# Patient Record
Sex: Female | Born: 1965 | Race: White | Hispanic: No | Marital: Married | Smoking: Never smoker
Health system: Southern US, Community
[De-identification: ages and names within clinical notes are randomized; demographics above are authoritative.]

## PROBLEM LIST (undated history)

## (undated) DIAGNOSIS — I10 Essential (primary) hypertension: Secondary | ICD-10-CM

## (undated) DIAGNOSIS — E119 Type 2 diabetes mellitus without complications: Secondary | ICD-10-CM

## (undated) DIAGNOSIS — Z9889 Other specified postprocedural states: Secondary | ICD-10-CM

## (undated) HISTORY — DX: Type 2 diabetes mellitus without complications: E11.9

## (undated) HISTORY — PX: GALLBLADDER SURGERY: SHX652

## (undated) HISTORY — PX: CHOLECYSTECTOMY: SHX55

## (undated) HISTORY — DX: Essential (primary) hypertension: I10

---

## 2016-07-28 ENCOUNTER — Encounter: Payer: Self-pay | Admitting: Neurology

## 2016-07-28 ENCOUNTER — Ambulatory Visit (INDEPENDENT_AMBULATORY_CARE_PROVIDER_SITE_OTHER): Payer: BLUE CROSS/BLUE SHIELD | Admitting: Neurology

## 2016-07-28 VITALS — BP 139/99 | HR 81 | Ht 64.0 in | Wt 188.2 lb

## 2016-07-28 DIAGNOSIS — R200 Anesthesia of skin: Secondary | ICD-10-CM

## 2016-07-28 DIAGNOSIS — R202 Paresthesia of skin: Secondary | ICD-10-CM | POA: Diagnosis not present

## 2016-07-28 NOTE — Patient Instructions (Signed)
I had a long discussion with the patient with regards to her intermittent right face and neck paresthesias which seem to have subsided. Continue gabapentin 300 mg at night for now. Check MRI scan of the brain with and without contrast as well as cervical spine to look for craniovertebral junction lesions. She was advised to call me if she had recurrent or new symptoms and see me for follow-up in 2 months.  Paresthesia Introduction Paresthesia is a burning or prickling feeling. This feeling can happen in any part of the body. It often happens in the hands, arms, legs, or feet. Usually, it is not painful. In most cases, the feeling goes away in a short time and is not a sign of a serious problem. Follow these instructions at home:  Avoid drinking alcohol.  Try massage or needle therapy (acupuncture) to help with your problems.  Keep all follow-up visits as told by your doctor. This is important. Contact a doctor if:  You keep on having episodes of paresthesia.  Your burning or prickling feeling gets worse when you walk.  You have pain or cramps.  You feel dizzy.  You have a rash. Get help right away if:  You feel weak.  You have trouble walking or moving.  You have problems speaking, understanding, or seeing.  You feel confused.  You cannot control when you pee (urinate) or poop (bowel movement).  You lose feeling (numbness) after an injury.  You pass out (faint). This information is not intended to replace advice given to you by your health care provider. Make sure you discuss any questions you have with your health care provider. Document Released: 05/14/2008 Document Revised: 11/07/2015 Document Reviewed: 05/28/2014  2017 Elsevier

## 2016-07-28 NOTE — Progress Notes (Signed)
[name] [address] [place] [address] [phone]       OFFICE CONSULT NOTE  [name] Date of Birth:  April 24, 1966 Medical Record Number:  [id number]   Referring MD: Consuello Masse Reason for Referral:  numbness  HPI: Ms Graves is a pleasant 51 year old Caucasian lady who noticed right face followed by neck numbness since November 2017. She is unable to attribute the onset of symptoms to any particular event. The numbness started gradually occurring occasionally off and on but there were periods when she had daily episodes of numbness. This lasts for several minutes to hours. There are no clear triggers. The numbness would last all day. She was started on Topamax by her primary physician which seem to have helped but she developed itching and despite taking Benadryl for a week she finally stopped it. She was switched to gabapentin 300 mg which she is taking at night. She states she's had no numbness would last 2 weeks. She is tolerating gabapentin well without any weight gain, edema or dizziness. Patient had a CT scan of the head done on 06/13/16 which have personally reviewed and was normal. She also had basic metabolic panel labs as well as CBC on 06/16/16 which were normal. Her cholesterol profile was borderline with LDL of 84 mg percent. Patient denies any significant headache except only on one day she had a mild headache. She denies any blurred vision, loss of vision, diplopia, vertigo, gait and balance problems. She denies significant fatigue, weight loss or change in appetite. She denies being under significant stress or anxiety. Patient had somewhat similar episode of numbness involving the right cheek in August 2013 and was seen by me. At that time MRI scan of the brain was obtained on 02/25/12 which was normal and MRA of the brain showed no significant intracranial stenosis. Patient did have a family history of a twin sister having carotid occlusion at age  76. She was started on aspirin and stated she has been compliant with her blood pressure and cholesterol medications and has not had any new neurovascular symptoms.  ROS:   14 system review of systems is positive for  tingling, numbness, headache and all other systems negative PMH:  Past Medical History:  Diagnosis Date  . Diabetes ([place])   . Hypertension     Social History:  Social History   Social History  . Marital status: Married    Spouse name: N/A  . Number of children: 2  . Years of education: 12   Occupational History  . [employer]    Social History Main Topics  . Smoking status: Never Smoker  . Smokeless tobacco: Never Used  . Alcohol use No  . Drug use: No  . Sexual activity: Not on file     Comment: Married   Other Topics Concern  . Not on file   Social History Narrative   Lives at home w/ her husband and son   Tomie China right-handed, eats left-handed   Caffeine: occasional soda    Medications:   No current outpatient prescriptions on file prior to visit.   No current facility-administered medications on file prior to visit.     Allergies:   Allergies  Allergen Reactions  . Lisinopril Cough  . Topiramate Itching    Physical Exam General: well developed, well nourished middle aged Caucasian lady, seated, in no evident distress Head: head normocephalic and atraumatic.   Neck: supple with no carotid or supraclavicular bruits Cardiovascular:  regular rate and rhythm, no murmurs Musculoskeletal: no deformity Skin:  no rash/petichiae Vascular:  Normal pulses all extremities  Neurologic Exam Mental Status: Awake and fully alert. Oriented to place and time. Recent and remote memory intact. Attention span, concentration and fund of knowledge appropriate. Mood and affect appropriate.  Cranial Nerves: Fundoscopic exam reveals sharp disc margins. Pupils equal, briskly reactive to light. Extraocular movements full without nystagmus. Visual fields  full to confrontation. Hearing intact. Facial sensation intact. Face, tongue, palate moves normally and symmetrically.  Motor: Normal bulk and tone. Normal strength in all tested extremity muscles. Sensory.: intact to touch , pinprick , position and vibratory sensation.  Coordination: Rapid alternating movements normal in all extremities. Finger-to-nose and heel-to-shin performed accurately bilaterally. Gait and Station: Arises from chair without difficulty. Stance is normal. Gait demonstrates normal stride length and balance . Able to heel, toe and tandem walk without difficulty.  Reflexes: 1+ and symmetric. Toes downgoing.       ASSESSMENT: 51 year old lady with intermittent right face and neck paresthesias of unclear etiology which seems to have resolved. Structural lesions at the craniovertebral junction need to be ruled out. Somewhat similar episode to in 2013 with negative evaluation at that time. Family history of premature cerebrovascular disease in the 19s but patient has had no focal TIA or stroke symptoms    PLAN: I had a long discussion with the patient with regards to her intermittent right face and neck paresthesias which seem to have subsided. Continue gabapentin 300 mg at night for now. Check MRI scan of the brain with and without contrast as well as cervical spine to look for craniovertebral junction lesions. She lost her continue aspirin for stroke prevention and maintain strict control of hypertension and lipids and eat a healthy diet and reactive andweight given her family history of premature cerebrovascular disease at a young age. She was advised to call me if she had recurrent or new symptoms and see me for follow-up in 2 months. Greater than 50% time during this 45 minute consultation visit was spent on counseling and coordination of care about her paresthesias, explanation of diagnosis and plan for evaluation, treatment and answering questions Antony Contras, MD  [facility] [facility] [address] [place], [place] [address]  Phone [phone] Fax [phone] Note: This document was prepared with digital dictation and possible smart phrase technology. Any transcriptional errors that result from this process are unintentional.

## 2016-07-29 ENCOUNTER — Telehealth: Payer: Self-pay | Admitting: Neurology

## 2016-07-29 NOTE — Telephone Encounter (Signed)
[employer] did not approve the MR Cervical they did approve the brain but since they are both linked together I can not get a authorization number. The member ID is [id number] & DOB is 2065-06-18. The phone number for the peer to peer is [phone]. Please do in 2 business days. Thank you for your help.

## 2016-07-29 NOTE — Telephone Encounter (Signed)
Noted, thank you for your help!  °

## 2016-07-29 NOTE — Telephone Encounter (Signed)
I spoke to peer to peer and got approval for both scans and auth [id number] and it is valid for 30 days from today

## 2016-08-06 ENCOUNTER — Ambulatory Visit (HOSPITAL_COMMUNITY)
Admission: RE | Admit: 2016-08-06 | Discharge: 2016-08-06 | Disposition: A | Discharge status: Home / Self Care | Payer: BLUE CROSS/BLUE SHIELD | Source: Ambulatory Visit | Attending: Neurology | Admitting: Neurology

## 2016-08-06 DIAGNOSIS — M47812 Spondylosis without myelopathy or radiculopathy, cervical region: Secondary | ICD-10-CM | POA: Insufficient documentation

## 2016-08-06 DIAGNOSIS — R200 Anesthesia of skin: Secondary | ICD-10-CM

## 2016-08-06 DIAGNOSIS — R202 Paresthesia of skin: Secondary | ICD-10-CM

## 2016-08-06 DIAGNOSIS — M4802 Spinal stenosis, cervical region: Secondary | ICD-10-CM | POA: Diagnosis not present

## 2016-08-06 LAB — POCT I-STAT CREATININE: Creatinine, Ser: 0.9 mg/dL (ref 0.44–1.00)

## 2016-08-06 MED ORDER — GADOBENATE DIMEGLUMINE 529 MG/ML IV SOLN
17.0000 mL | Freq: Once | INTRAVENOUS | Status: AC | PRN
  Administered 2016-08-06: 17 mL via INTRAVENOUS

## 2016-09-07 ENCOUNTER — Telehealth: Payer: Self-pay | Admitting: Neurology

## 2016-09-07 NOTE — Telephone Encounter (Signed)
Autumn Graves with [employer] is calling to see if authorization was gotten for the patient who had an MRI on 08-06-16. [employer] denied the claim.

## 2016-09-07 NOTE — Telephone Encounter (Signed)
I spoke to a man in billing with [employer].. I gave him all my information for the authorization not require for her to have the MRI.. I informed him I spoke to [place] on 07/29/16 at 8:33 AM Central time informed me no Josem Kaufmann was require. Then the patient called me on  08/28/16 called me and informed me that her [employer] insurance was not going to pay for it because they told her a Josem Kaufmann is required... I then called [employer] again and on 08/28/16 Jan D informed me Josem Kaufmann was not require and the reference # is [id number].Marland Kitchen

## 2016-10-06 ENCOUNTER — Ambulatory Visit: Payer: BLUE CROSS/BLUE SHIELD | Admitting: Neurology

## 2016-11-23 ENCOUNTER — Encounter (HOSPITAL_COMMUNITY): Payer: Self-pay | Admitting: Emergency Medicine

## 2016-11-23 ENCOUNTER — Emergency Department (HOSPITAL_COMMUNITY)
Admission: EM | Admit: 2016-11-23 | Discharge: 2016-11-23 | Disposition: A | Discharge status: Home / Self Care | Payer: Worker's Compensation | Attending: Emergency Medicine | Admitting: Emergency Medicine

## 2016-11-23 DIAGNOSIS — S0592XA Unspecified injury of left eye and orbit, initial encounter: Secondary | ICD-10-CM | POA: Diagnosis present

## 2016-11-23 DIAGNOSIS — Y9389 Activity, other specified: Secondary | ICD-10-CM | POA: Insufficient documentation

## 2016-11-23 DIAGNOSIS — Y9259 Other trade areas as the place of occurrence of the external cause: Secondary | ICD-10-CM | POA: Diagnosis not present

## 2016-11-23 DIAGNOSIS — Y990 Civilian activity done for income or pay: Secondary | ICD-10-CM | POA: Insufficient documentation

## 2016-11-23 DIAGNOSIS — S0512XA Contusion of eyeball and orbital tissues, left eye, initial encounter: Secondary | ICD-10-CM | POA: Diagnosis not present

## 2016-11-23 DIAGNOSIS — W208XXA Other cause of strike by thrown, projected or falling object, initial encounter: Secondary | ICD-10-CM | POA: Insufficient documentation

## 2016-11-23 MED ORDER — IBUPROFEN 800 MG PO TABS
800.0000 mg | ORAL_TABLET | Freq: Once | ORAL | Status: AC
  Administered 2016-11-23: 800 mg via ORAL
  Filled 2016-11-23: qty 1

## 2016-11-23 MED ORDER — FLUORESCEIN SODIUM 0.6 MG OP STRP
1.0000 | ORAL_STRIP | Freq: Once | OPHTHALMIC | Status: AC
  Administered 2016-11-23: 1 via OPHTHALMIC
  Filled 2016-11-23: qty 1

## 2016-11-23 MED ORDER — TETRACAINE HCL 0.5 % OP SOLN
2.0000 [drp] | Freq: Once | OPHTHALMIC | Status: AC
  Administered 2016-11-23: 2 [drp] via OPHTHALMIC
  Filled 2016-11-23: qty 4

## 2016-11-23 MED ORDER — ACETAMINOPHEN 325 MG PO TABS
650.0000 mg | ORAL_TABLET | Freq: Once | ORAL | Status: AC
  Administered 2016-11-23: 650 mg via ORAL
  Filled 2016-11-23: qty 2

## 2016-11-23 NOTE — ED Triage Notes (Signed)
Moved a wood pallet and got a piece of wood in her lt eye,  Used the eyewash station at work. C/o pain and irritation to that eye

## 2016-11-23 NOTE — Discharge Instructions (Signed)
Your vital signs have been reviewed. Your examination does not reveal any foreign body in eye. There does appear to be a contusion to the left corner of your eye as well as the conjunctiva of your left eye. This may change colors by tomorrow a formal bruising. Please use a cool compress to the left eye to 3 times daily. Please use Tylenol every 4 hours for mild pain, use ibuprofen every 6 hours for severe pain. Please see Dr. Quintin Alto or return to the emergency department if not improving.

## 2016-11-23 NOTE — ED Notes (Signed)
Pt carried to the lab area for urine drug screen.

## 2016-11-23 NOTE — ED Provider Notes (Signed)
[facility] DEPT Provider Note   CSN: [id number] Arrival date & time: 11/23/16  1930     History   Chief Complaint Chief Complaint  Patient presents with  . Eye Injury    HPI Autumn Graves is a 51 y.o. female.  Patient is a 51 year old female who presents to the emergency department with a complaint of left eye pain.  The patient states that she was at work. She was working with a Furniture conservator/restorer. When a piece of wood slipped off the palate and hit her in the eye. She went immediately to the eye wash station. She continued to have pain in the left eye and he presents now to the emergency department for evaluation concerning the injury. She denies any changes in her vision. She denies any excessive drainage, and she has not seen any blood from her arm. The been no recent operations or procedures involving her eye.      Past Medical History:  Diagnosis Date  . Diabetes ([place])   . Hypertension     Patient Active Problem List   Diagnosis Date Noted  . Numbness and tingling of right side of face 07/28/2016  . Paresthesia 07/28/2016    Past Surgical History:  Procedure Laterality Date  . CESAREAN SECTION  1990  . CHOLECYSTECTOMY    . GALLBLADDER SURGERY      OB History    No data available       Home Medications    Prior to Admission medications   Medication Sig Start Date End Date Taking? Authorizing Provider  aspirin 81 MG tablet Take 81 mg by mouth daily.    [provider]  esomeprazole (NEXIUM) 20 MG capsule Take 20 mg by mouth daily.    [provider]  gabapentin (NEURONTIN) 300 MG capsule Take 300 mg by mouth at bedtime. 07/08/16   [provider]  hydrochlorothiazide (MICROZIDE) 12.5 MG capsule Take 12.5 mg by mouth daily.  06/28/16   [provider]  metFORMIN (GLUCOPHAGE-XR) 500 MG 24 hr tablet Take 500 mg by mouth daily. 07/08/16   [provider]  simvastatin (ZOCOR) 20 MG tablet Take 20 mg by mouth daily at 6 PM.   06/28/16   [provider]  traMADol (ULTRAM) 50 MG tablet Take by mouth every 12 (twelve) hours as needed.    [provider]    Family History Family History  Problem Relation Age of Onset  . Cancer Mother   . Heart failure Father   . Stroke Sister   . Colon cancer Maternal Grandmother   . Stroke Maternal Grandfather     Social History Social History  Substance Use Topics  . Smoking status: Never Smoker  . Smokeless tobacco: Never Used  . Alcohol use No     Allergies   Lisinopril and Topiramate   Review of Systems Review of Systems  Constitutional: Negative for activity change and appetite change.  HENT: Negative for congestion, ear discharge, ear pain, facial swelling, nosebleeds, rhinorrhea, sneezing and tinnitus.   Eyes: Positive for pain and redness. Negative for photophobia, discharge, itching and visual disturbance.  Respiratory: Negative for cough, choking, shortness of breath and wheezing.   Cardiovascular: Negative for chest pain, palpitations and leg swelling.  Gastrointestinal: Negative for abdominal pain, blood in stool, constipation, diarrhea, nausea and vomiting.  Genitourinary: Negative for difficulty urinating, dysuria, flank pain, frequency and hematuria.  Musculoskeletal: Negative for back pain, gait problem, myalgias and neck pain.  Skin: Negative for color change, rash  and wound.  Neurological: Negative for dizziness, seizures, syncope, facial asymmetry, speech difficulty, weakness and numbness.  Hematological: Negative for adenopathy. Does not bruise/bleed easily.  Psychiatric/Behavioral: Negative for agitation, confusion, hallucinations, self-injury and suicidal ideas. The patient is not nervous/anxious.      Physical Exam Updated Vital Signs BP (!) 150/98 (BP Location: Right Arm)   Pulse 95   Temp 97.8 F (36.6 C) (Oral)   Resp 20   Ht 5\' 4"  (1.626 m)   Wt 83 kg (183 lb)   SpO2 97%   BMI 31.41 kg/m   Physical Exam    Constitutional: She appears well-developed and well-nourished. No distress.  HENT:  Head: Normocephalic and atraumatic.  Right Ear: External ear normal.  Left Ear: External ear normal.  Eyes: EOM are normal. Pupils are equal, round, and reactive to light. Lids are everted and swept, no foreign bodies found. Right eye exhibits no discharge. Left eye exhibits no discharge and no hordeolum. No foreign body present in the left eye. Left conjunctiva is injected. Left conjunctiva has no hemorrhage. No scleral icterus.  Fundoscopic exam:      The left eye shows no AV nicking, no hemorrhage and no papilledema.  Slit lamp exam:      The right eye shows no anterior chamber bulge.       The left eye shows no corneal abrasion, no corneal ulcer, no foreign body, no hyphema and no anterior chamber bulge.    Mild fluorescein uptake at the 4:00 position of the left conjunctiva. No corneal abrasion or fb noted.  Neck: Neck supple. No tracheal deviation present.  Cardiovascular: Normal rate, regular rhythm and intact distal pulses.   Pulmonary/Chest: Effort normal and breath sounds normal. No stridor. No respiratory distress. She has no wheezes. She has no rales.  Abdominal: Soft. Bowel sounds are normal. She exhibits no distension. There is no tenderness. There is no rebound and no guarding.  Musculoskeletal: She exhibits no edema or tenderness.  Neurological: She is alert. She has normal strength. No cranial nerve deficit (no facial droop, extraocular movements intact, no slurred speech) or sensory deficit. She exhibits normal muscle tone. She displays no seizure activity. Coordination normal.  Skin: Skin is warm and dry. No rash noted.  Psychiatric: She has a normal mood and affect.  Nursing note and vitals reviewed.    ED Treatments / Results  Labs (all labs ordered are listed, but only abnormal results are displayed) Labs Reviewed - No data to display  EKG  EKG Interpretation None        Radiology No results found.  Procedures Procedures (including critical care time)  Medications Ordered in ED Medications  ibuprofen (ADVIL,MOTRIN) tablet 800 mg (not administered)  acetaminophen (TYLENOL) tablet 650 mg (not administered)  tetracaine (PONTOCAINE) 0.5 % ophthalmic solution 2 drop (2 drops Left Eye Given by Other 11/23/16 2017)  fluorescein ophthalmic strip 1 strip (1 strip Left Eye Given by Other 11/23/16 2017)     Initial Impression / Assessment and Plan / ED Course  I have reviewed the triage vital signs and the nursing notes.  Pertinent labs & imaging results that were available during my care of the patient were reviewed by me and considered in my medical decision making (see chart for details).       Final Clinical Impressions(s) / ED Diagnoses MDM Blood pressure is slightly elevated at 150/98. The remainder the vital signs are within normal limits. Visual acuity reviewed. The patient has a contusion at  the corner of the left eye and also at the 4:00 position on the conjunctiva. No significant hemorrhage appreciated at this time. The patient is asked to use a cool compress to the eye. She will use Tylenol every 4 hours or ibuprofen every 6 hours for pain or discomfort. The patient is reassured that there was no foreign body noted on the funduscopic or the fluoroscopy seen exam on. The patient will return to the emergency department for additional evaluation and management if any changes, problems, or concerns.    Final diagnoses:  Contusion of left conjunctiva, initial encounter  Contusion of left eye, initial encounter    New Prescriptions New Prescriptions   No medications on file     Lily Kocher, Hershal Coria 11/25/16 1008    Mesner, Corene Cornea, MD 11/25/16 1642

## 2016-12-29 ENCOUNTER — Encounter: Payer: Self-pay | Admitting: Neurology

## 2016-12-29 ENCOUNTER — Ambulatory Visit (INDEPENDENT_AMBULATORY_CARE_PROVIDER_SITE_OTHER): Payer: BLUE CROSS/BLUE SHIELD | Admitting: Neurology

## 2016-12-29 VITALS — BP 137/88 | HR 74 | Wt 182.0 lb

## 2016-12-29 DIAGNOSIS — R202 Paresthesia of skin: Secondary | ICD-10-CM

## 2016-12-29 NOTE — Patient Instructions (Signed)
I had along d/w patient regarding her intermittent right face and neck paresthesias and reviewed imaging films and answered questions. Recommend continue gabapentin prn and also consider additional daytime dose if necessary and tolerated. If she has sleepiness may try 100 mg dose during daytime. She voiced understanding. Return for f/u only as needed.   Paresthesia Paresthesia is a burning or prickling feeling. This feeling can happen in any part of the body. It often happens in the hands, arms, legs, or feet. Usually, it is not painful. In most cases, the feeling goes away in a short time and is not a sign of a serious problem. Follow these instructions at home:  Avoid drinking alcohol.  Try massage or needle therapy (acupuncture) to help with your problems.  Keep all follow-up visits as told by your doctor. This is important. Contact a doctor if:  You keep on having episodes of paresthesia.  Your burning or prickling feeling gets worse when you walk.  You have pain or cramps.  You feel dizzy.  You have a rash. Get help right away if:  You feel weak.  You have trouble walking or moving.  You have problems speaking, understanding, or seeing.  You feel confused.  You cannot control when you pee (urinate) or poop (bowel movement).  You lose feeling (numbness) after an injury.  You pass out (faint). This information is not intended to replace advice given to you by your health care provider. Make sure you discuss any questions you have with your health care provider. Document Released: 05/14/2008 Document Revised: 11/07/2015 Document Reviewed: 05/28/2014 Elsevier Interactive Patient Education  [employer].

## 2016-12-29 NOTE — Progress Notes (Signed)
[name] [address] [place] [address] [phone]       OFFICE FOLLOW UP VISIT NOTE  Autumn. Autumn Graves Date of Birth:  [birth year] Medical Record Number:  [id number]   Referring MD: Consuello Masse Reason for Referral:  numbness  [id number] Consult 07/28/2016 : Autumn Graves is a pleasant 51 year old Caucasian lady who noticed right face followed by neck numbness since November 2017. She is unable to attribute the onset of symptoms to any particular event. The numbness started gradually occurring occasionally off and on but there were periods when she had daily episodes of numbness. This lasts for several minutes to hours. There are no clear triggers. The numbness would last all day. She was started on Topamax by her primary physician which seem to have helped but she developed itching and despite taking Benadryl for a week she finally stopped it. She was switched to gabapentin 300 mg which she is taking at night. She states she's had no numbness would last 2 weeks. She is tolerating gabapentin well without any weight gain, edema or dizziness. Patient had a CT scan of the head done on 06/13/16 which have personally reviewed and was normal. She also had basic metabolic panel labs as well as CBC on 06/16/16 which were normal. Her cholesterol profile was borderline with LDL of 84 mg percent. Patient denies any significant headache except only on one day she had a mild headache. She denies any blurred vision, loss of vision, diplopia, vertigo, gait and balance problems. She denies significant fatigue, weight loss or change in appetite. She denies being under significant stress or anxiety. Patient had somewhat similar episode of numbness involving the right cheek in August 2013 and was seen by me. At that time MRI scan of the brain was obtained on 02/25/12 which was normal and MRA of the brain showed no significant intracranial stenosis. Patient did have a family history of a twin  sister having carotid occlusion at age 69. She was started on aspirin and stated she has been compliant with her blood pressure and cholesterol medications and has not had any new neurovascular symptoms. Update 12/29/2016 : She returns for follow-up after last visit 5 months ago. She states that numbness and tingling in the right side of the face and neck and very rarely in the right hand is much improved. This can still occur around once a week or so. Last less than an hour. She does take gabapentin which seems to help. She had MRI scan of the brain done on 08/06/16 which have personally reviewed and shows no significant abnormality. MRI scan of cervical spine shows bulging disc at C5-6 and C6-7 with mild canal narrowing but no significant root impingement. Patient denies any significant headaches, radicular pain and weakness gait or balance problems. She has no new complaints today. ROS:   14 system review of systems is positive for  tingling, numbness, back pain and all other systems negative PMH:  Past Medical History:  Diagnosis Date  . Diabetes ([place])   . Hypertension     Social History:  Social History   Social History  . Marital status: Married    Spouse name: N/A  . Number of children: 2  . Years of education: 12   Occupational History  . [employer]    Social History Main Topics  . Smoking status: Never Smoker  . Smokeless tobacco: Never Used  . Alcohol use No  . Drug use: No  . Sexual activity:  Not on file     Comment: Married   Other Topics Concern  . Not on file   Social History Narrative   Lives at home w/ her husband and son   Tomie China right-handed, eats left-handed   Caffeine: occasional soda    Medications:   Current Outpatient Prescriptions on File Prior to Visit  Medication Sig Dispense Refill  . aspirin 81 MG tablet Take 81 mg by mouth daily.    Marland Kitchen esomeprazole (NEXIUM) 20 MG capsule Take 20 mg by mouth daily.    Marland Kitchen gabapentin (NEURONTIN) 300 MG  capsule Take 300 mg by mouth at bedtime.  12  . hydrochlorothiazide (MICROZIDE) 12.5 MG capsule Take 12.5 mg by mouth daily.   11  . metFORMIN (GLUCOPHAGE-XR) 500 MG 24 hr tablet Take 500 mg by mouth daily.  3  . simvastatin (ZOCOR) 20 MG tablet Take 20 mg by mouth daily at 6 PM.   11  . traMADol (ULTRAM) 50 MG tablet Take by mouth every 12 (twelve) hours as needed.     No current facility-administered medications on file prior to visit.     Allergies:   Allergies  Allergen Reactions  . Lisinopril Cough  . Topiramate Itching    Physical Exam General: well developed, well nourished middle aged Caucasian lady, seated, in no evident distress Head: head normocephalic and atraumatic.   Neck: supple with no carotid or supraclavicular bruits Cardiovascular: regular rate and rhythm, no murmurs Musculoskeletal: no deformity Skin:  no rash/petichiae Vascular:  Normal pulses all extremities  Neurologic Exam Mental Status: Awake and fully alert. Oriented to place and time. Recent and remote memory intact. Attention span, concentration and fund of knowledge appropriate. Mood and affect appropriate.  Cranial Nerves: Fundoscopic exam not done Pupils equal, briskly reactive to light. Extraocular movements full without nystagmus. Visual fields full to confrontation. Hearing intact. Facial sensation intact. Face, tongue, palate moves normally and symmetrically.  Motor: Normal bulk and tone. Normal strength in all tested extremity muscles. Sensory.: intact to touch , pinprick , position and vibratory sensation.  Coordination: Rapid alternating movements normal in all extremities. Finger-to-nose and heel-to-shin performed accurately bilaterally. Gait and Station: Arises from chair without difficulty. Stance is normal. Gait demonstrates normal stride length and balance . Able to heel, toe and tandem walk without difficulty.  Reflexes: 1+ and symmetric. Toes downgoing.       ASSESSMENT: 51 year old  lady with intermittent right face and neck paresthesias of unclear etiology which seems to have resolved. Structural lesions at the craniovertebral junction need to be ruled out. Somewhat similar episode to in 2013 with negative evaluation at that time.     PLAN: I had along d/w patient regarding her intermittent right face and neck paresthesias and reviewed imaging films and answered questions. Recommend continue gabapentin prn and also consider additional daytime dose if necessary and tolerated. If she has sleepiness may try 100 mg dose during daytime. She voiced understanding. Return for f/u only as needed. Greater than 50% time during this 25 minute  visit was spent on counseling and coordination of care about her paresthesias, explanation of diagnosis and plan for evaluation, treatment and answering questions Antony Contras, MD  [facility] [address] [place], [place] [address]  Phone [phone] Fax [phone] Note: This document was prepared with digital dictation and possible smart phrase technology. Any transcriptional errors that result from this process are unintentional.

## 2017-03-18 DIAGNOSIS — E1165 Type 2 diabetes mellitus with hyperglycemia: Secondary | ICD-10-CM | POA: Diagnosis not present

## 2017-03-18 DIAGNOSIS — E782 Mixed hyperlipidemia: Secondary | ICD-10-CM | POA: Diagnosis not present

## 2017-03-18 DIAGNOSIS — E1140 Type 2 diabetes mellitus with diabetic neuropathy, unspecified: Secondary | ICD-10-CM | POA: Diagnosis not present

## 2017-03-18 DIAGNOSIS — E7800 Pure hypercholesterolemia, unspecified: Secondary | ICD-10-CM | POA: Diagnosis not present

## 2017-03-23 DIAGNOSIS — I10 Essential (primary) hypertension: Secondary | ICD-10-CM | POA: Diagnosis not present

## 2017-03-23 DIAGNOSIS — E782 Mixed hyperlipidemia: Secondary | ICD-10-CM | POA: Diagnosis not present

## 2017-03-23 DIAGNOSIS — E6609 Other obesity due to excess calories: Secondary | ICD-10-CM | POA: Diagnosis not present

## 2017-03-23 DIAGNOSIS — R51 Headache: Secondary | ICD-10-CM | POA: Diagnosis not present

## 2017-03-23 DIAGNOSIS — Z23 Encounter for immunization: Secondary | ICD-10-CM | POA: Diagnosis not present

## 2017-04-24 IMAGING — MR MR HEAD WO/W CM
9 of 14 series · 27 of 48 positions shown · IV contrast (multihance)
Comparison: CT head 06/13/2016.

CLINICAL DATA: RIGHT-sided facial numbness and RIGHT arm numbness
for 3 months. No known injury.

EXAM:
MRI HEAD WITHOUT AND WITH CONTRAST
MRI CERVICAL SPINE WITHOUT AND WITH CONTRAST
TECHNIQUE: Multiplanar, multiecho pulse sequences of the brain and surrounding
structures, and cervical spine, to include the craniocervical
junction and cervicothoracic junction, were obtained without and
with intravenous contrast.
CONTRAST:  17mL MULTIHANCE GADOBENATE DIMEGLUMINE 529 MG/ML IV SOLN

[Series 6: DWI · axial · 3.0mm · 0.82mm/px · z∈[-116,+31]mm · 4 of 50 slices shown (1 of 4)]
[im 1/50]
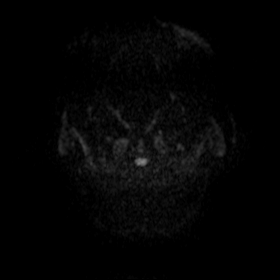
[im 17/50]
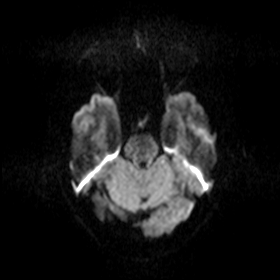
[im 33/50]
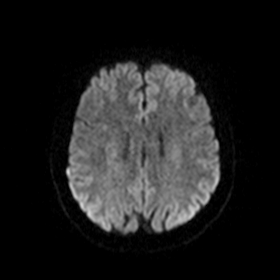
[im 50/50]
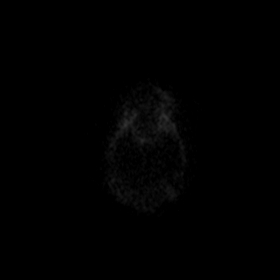

[Series 7: DWI · axial · 3.0mm · 0.82mm/px · z∈[-116,+31]mm · 4 of 50 slices shown (2 of 4)]
[im 1/50]
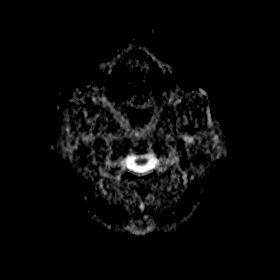
[im 17/50]
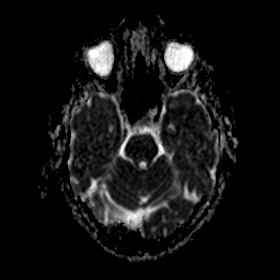
[im 33/50]
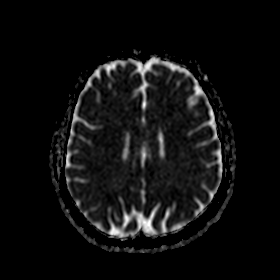
[im 50/50]
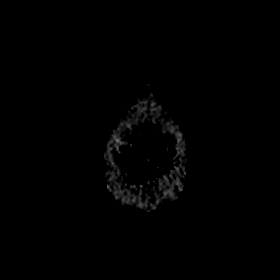

[Series 8: DWI · coronal · 5.0mm · 0.48mm/px · 2 of 32 slices shown (3 of 4)]
[im 1/32]
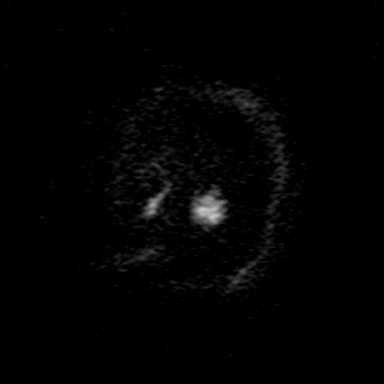
[im 32/32]
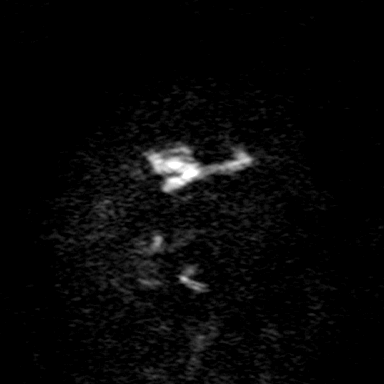

[Series 9: DWI · coronal · 5.0mm · 0.48mm/px · 2 of 32 slices shown (4 of 4)]
[im 1/32]
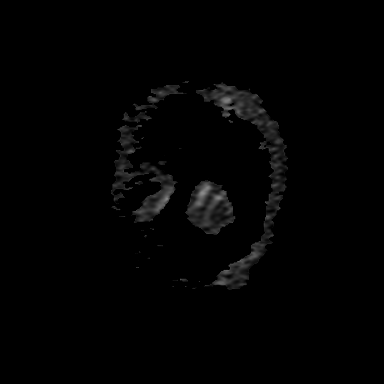
[im 32/32]
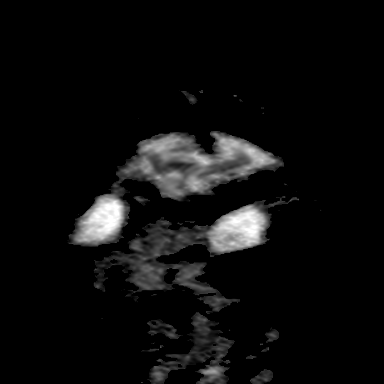

[Series 10: T2 · axial · 5.0mm · 0.60mm/px · z∈[-116,+27]mm · 2 of 23 slices shown (1 of 2)]
[im 1/23]
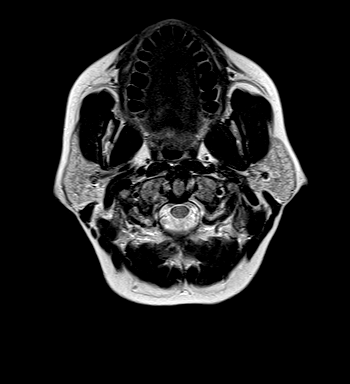
[im 23/23]
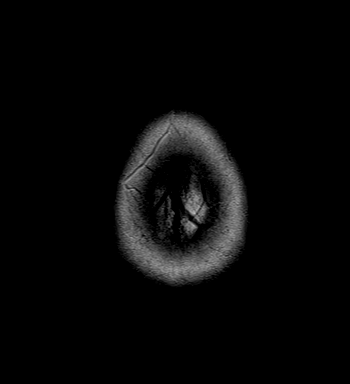

[Series 11: FLAIR · axial · 5.0mm · 0.38mm/px · z∈[-115,+27]mm · 2 of 23 slices shown]
[im 1/23]
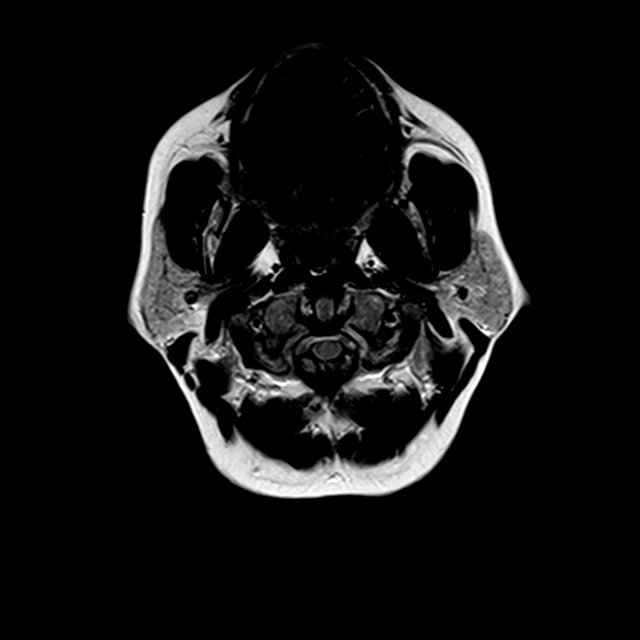
[im 23/23]
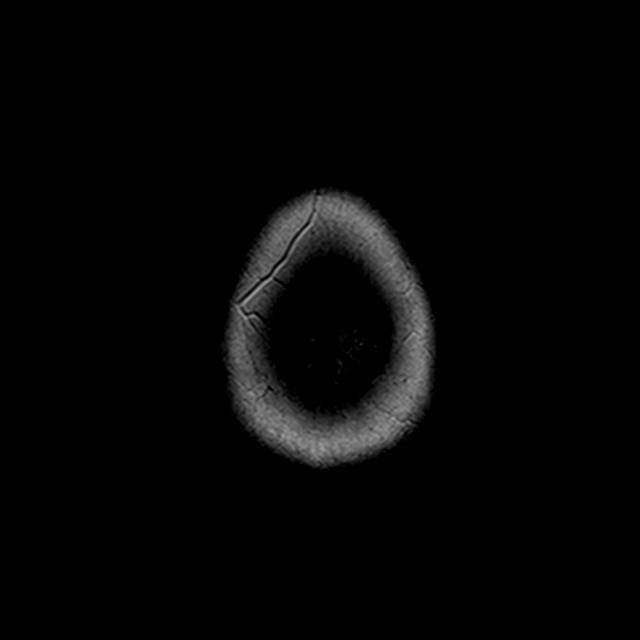

[Series 15: T2 · coronal · 5.0mm · 0.49mm/px · 2 of 28 slices shown (2 of 2)]
[im 1/28]
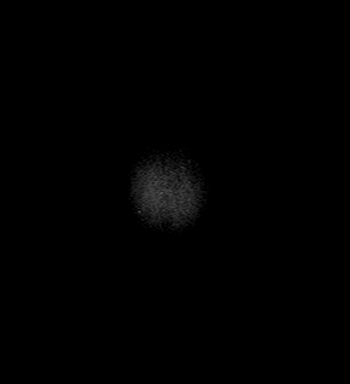
[im 28/28]
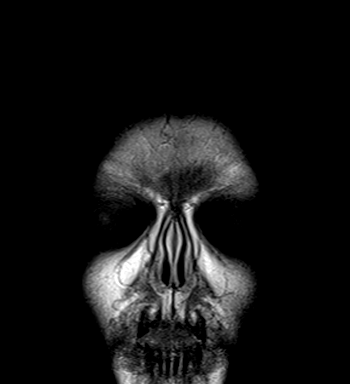

[Series 16: T1 post-contrast · axial · 2.0mm · 0.45mm/px · z∈[-130,+58]mm · 7 of 95 slices shown (1 of 2)]
[im 1/95]
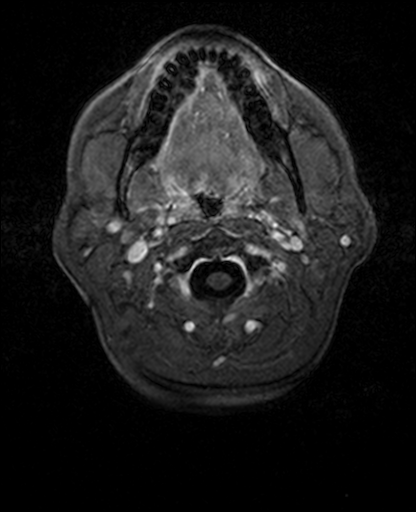
[im 16/95]
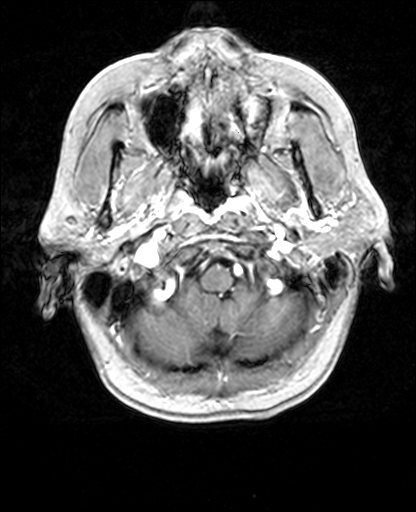
[im 32/95]
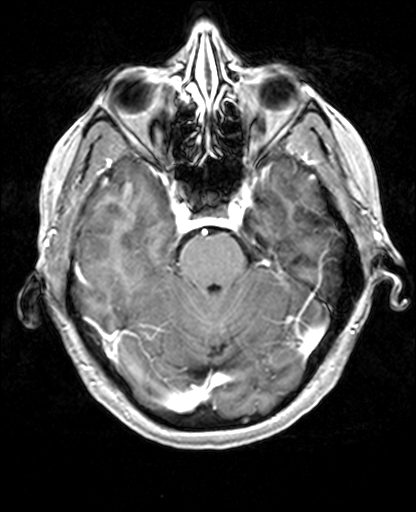
[im 48/95]
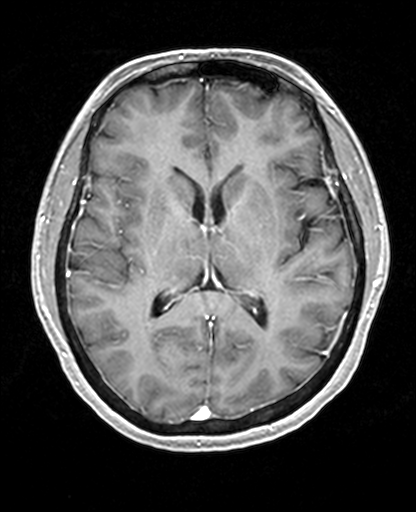
[im 63/95]
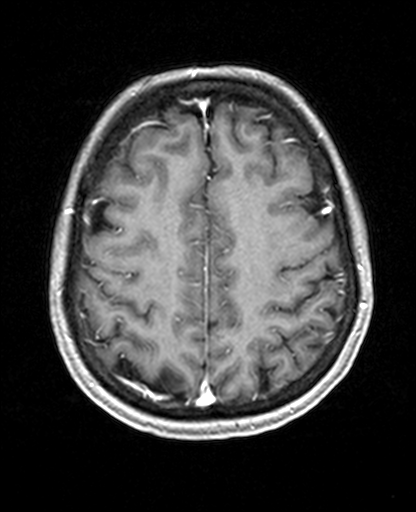
[im 79/95]
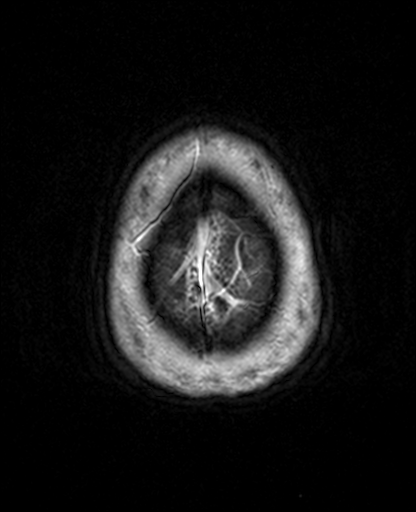
[im 95/95]
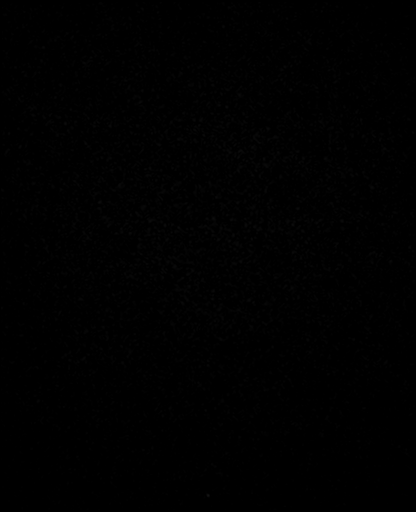

[Series 17: T1 post-contrast · coronal · 5.0mm · 0.40mm/px · 2 of 24 slices shown (2 of 2)]
[im 1/24]
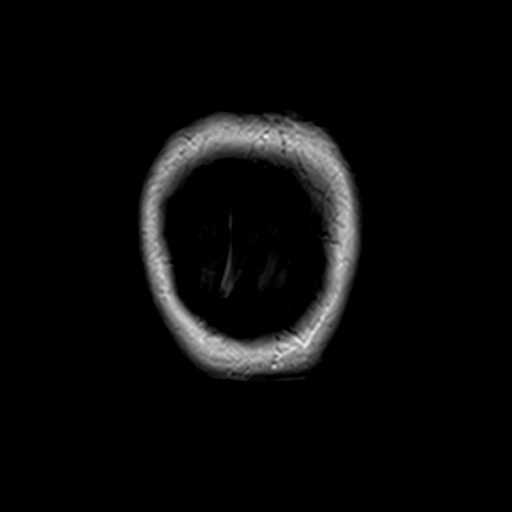
[im 24/24]
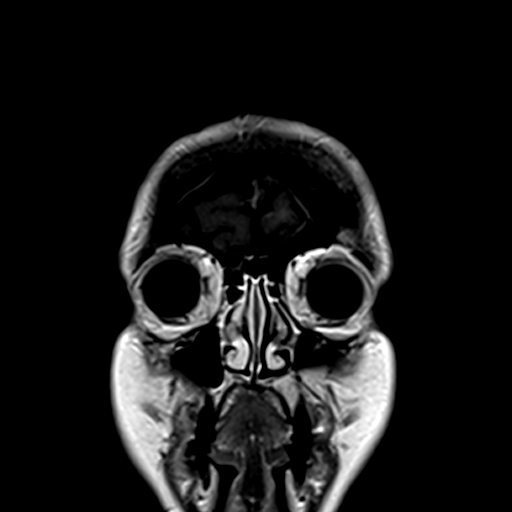

[27 of 48 positions shown; findings below may reference images not displayed]

FINDINGS: MRI HEAD FINDINGS

Brain: No evidence for acute infarction, hemorrhage, mass lesion,
hydrocephalus, or extra-axial fluid. Normal cerebral volume. Minor
white matter disease, nonspecific, likely age related or chronic
microvascular ischemic change. No periventricular predominance to
suggest demyelinating disease.

Post infusion, no abnormal enhancement of the brain or meninges.

Vascular: Flow voids are maintained throughout the carotid, basilar,
and vertebral arteries. There are no areas of chronic hemorrhage.
Major dural venous sinuses are patent.

Skull and upper cervical spine: Unremarkable visualized calvarium,
skullbase, and cervical vertebrae. Pituitary, pineal, cerebellar
tonsils unremarkable.

Sinuses/Orbits: Hypoplastic LEFT maxillary sinus. No layering fluid.
Unremarkable orbits.

Other: No nasopharyngeal pathology or mastoid fluid. Scalp and other
visualized extracranial soft tissues grossly unremarkable.

MRI CERVICAL SPINE FINDINGS

Alignment: Anatomic.

Vertebrae: No worrisome osseous lesions. Modic type 1 changes above
and below C6-C7 active ongoing degenerative change with postcontrast
enhancement. No features suggestive of discitis or worrisome osseous
lesion.

Cord: Stenosis at C5-6 and C6-7 with effacement anterior
subarachnoid space, mild cord flattening. No abnormal enhancement of
the cord. No craniocervical junction lesion. No syrinx.

Posterior Fossa, vertebral arteries, paraspinal tissues: Patent and
equal vertebral arteries. No neck masses.

Disc levels:

C2-3:  Normal.

C3-4:  Normal.

C4-5:  Annular bulge.

C5-6: Disc space narrowing. Central protrusion with osseous ridging.
Mild cord flattening. BILATERAL C6 foraminal narrowing.

C6-7: Disc space narrowing. Central protrusion with osseous ridging.
Mild cord flattening. BILATERAL C7 foraminal narrowing, worse on the
LEFT.

C7-T1:  Unremarkable.
IMPRESSION: No acute intracranial abnormality. No abnormal postcontrast
enhancement.

Moderately advanced cervical spondylosis at C5-6 and C6-7. Stenosis
and mild cord flattening at both levels without abnormal cord
signal. Neural foraminal narrowing could affect the exiting C6
and/or C7 nerve roots.

No craniovertebral junction lesion, or intrinsic cord abnormality.

## 2017-05-04 DIAGNOSIS — M79672 Pain in left foot: Secondary | ICD-10-CM | POA: Diagnosis not present

## 2017-05-04 DIAGNOSIS — M25579 Pain in unspecified ankle and joints of unspecified foot: Secondary | ICD-10-CM | POA: Diagnosis not present

## 2017-05-04 DIAGNOSIS — M79671 Pain in right foot: Secondary | ICD-10-CM | POA: Diagnosis not present

## 2017-06-01 DIAGNOSIS — M25579 Pain in unspecified ankle and joints of unspecified foot: Secondary | ICD-10-CM | POA: Diagnosis not present

## 2017-06-01 DIAGNOSIS — M79672 Pain in left foot: Secondary | ICD-10-CM | POA: Diagnosis not present

## 2017-06-01 DIAGNOSIS — M79671 Pain in right foot: Secondary | ICD-10-CM | POA: Diagnosis not present

## 2017-07-01 DIAGNOSIS — B372 Candidiasis of skin and nail: Secondary | ICD-10-CM | POA: Diagnosis not present

## 2017-07-01 DIAGNOSIS — Z6832 Body mass index (BMI) 32.0-32.9, adult: Secondary | ICD-10-CM | POA: Diagnosis not present

## 2017-07-22 DIAGNOSIS — E782 Mixed hyperlipidemia: Secondary | ICD-10-CM | POA: Diagnosis not present

## 2017-07-22 DIAGNOSIS — E1165 Type 2 diabetes mellitus with hyperglycemia: Secondary | ICD-10-CM | POA: Diagnosis not present

## 2017-07-22 DIAGNOSIS — E1140 Type 2 diabetes mellitus with diabetic neuropathy, unspecified: Secondary | ICD-10-CM | POA: Diagnosis not present

## 2017-07-22 DIAGNOSIS — E7800 Pure hypercholesterolemia, unspecified: Secondary | ICD-10-CM | POA: Diagnosis not present

## 2017-07-27 DIAGNOSIS — Z6832 Body mass index (BMI) 32.0-32.9, adult: Secondary | ICD-10-CM | POA: Diagnosis not present

## 2017-07-27 DIAGNOSIS — G629 Polyneuropathy, unspecified: Secondary | ICD-10-CM | POA: Diagnosis not present

## 2017-07-27 DIAGNOSIS — E1140 Type 2 diabetes mellitus with diabetic neuropathy, unspecified: Secondary | ICD-10-CM | POA: Diagnosis not present

## 2017-07-27 DIAGNOSIS — F411 Generalized anxiety disorder: Secondary | ICD-10-CM | POA: Diagnosis not present

## 2017-07-27 DIAGNOSIS — E782 Mixed hyperlipidemia: Secondary | ICD-10-CM | POA: Diagnosis not present

## 2017-08-05 ENCOUNTER — Encounter (INDEPENDENT_AMBULATORY_CARE_PROVIDER_SITE_OTHER): Payer: Self-pay | Admitting: *Deleted

## 2017-08-10 DIAGNOSIS — Z1231 Encounter for screening mammogram for malignant neoplasm of breast: Secondary | ICD-10-CM | POA: Diagnosis not present

## 2017-09-21 DIAGNOSIS — M720 Palmar fascial fibromatosis [Dupuytren]: Secondary | ICD-10-CM | POA: Diagnosis not present

## 2017-10-07 ENCOUNTER — Encounter (INDEPENDENT_AMBULATORY_CARE_PROVIDER_SITE_OTHER): Payer: Self-pay | Admitting: *Deleted

## 2017-10-08 ENCOUNTER — Other Ambulatory Visit (INDEPENDENT_AMBULATORY_CARE_PROVIDER_SITE_OTHER): Payer: Self-pay | Admitting: *Deleted

## 2017-10-08 DIAGNOSIS — Z1211 Encounter for screening for malignant neoplasm of colon: Secondary | ICD-10-CM | POA: Insufficient documentation

## 2017-10-08 DIAGNOSIS — Z800 Family history of malignant neoplasm of digestive organs: Secondary | ICD-10-CM

## 2017-10-18 DIAGNOSIS — M720 Palmar fascial fibromatosis [Dupuytren]: Secondary | ICD-10-CM | POA: Diagnosis not present

## 2017-10-21 DIAGNOSIS — M720 Palmar fascial fibromatosis [Dupuytren]: Secondary | ICD-10-CM | POA: Diagnosis not present

## 2017-10-21 DIAGNOSIS — M25641 Stiffness of right hand, not elsewhere classified: Secondary | ICD-10-CM | POA: Diagnosis not present

## 2017-10-21 DIAGNOSIS — M79644 Pain in right finger(s): Secondary | ICD-10-CM | POA: Diagnosis not present

## 2017-12-13 ENCOUNTER — Telehealth (INDEPENDENT_AMBULATORY_CARE_PROVIDER_SITE_OTHER): Payer: Self-pay | Admitting: *Deleted

## 2017-12-13 ENCOUNTER — Encounter (INDEPENDENT_AMBULATORY_CARE_PROVIDER_SITE_OTHER): Payer: Self-pay | Admitting: *Deleted

## 2017-12-13 MED ORDER — PEG 3350-KCL-NA BICARB-NACL 420 G PO SOLR: 4000.0000 mL | Freq: Once | ORAL | 0 refills | Status: AC

## 2017-12-13 NOTE — Telephone Encounter (Signed)
Patient needs trilyte 

## 2017-12-21 ENCOUNTER — Telehealth (INDEPENDENT_AMBULATORY_CARE_PROVIDER_SITE_OTHER): Payer: Self-pay | Admitting: *Deleted

## 2017-12-21 NOTE — Telephone Encounter (Signed)
agree

## 2017-12-21 NOTE — Telephone Encounter (Signed)
Referring MD/PCP: sasser   Procedure: tcs  Reason/Indication:  Screening, fam hx colon ca  Has patient had this procedure before?  no  If so, when, by whom and where?    Is there a family history of colon cancer?  Yes, grandmother  Who?  What age when diagnosed?    Is patient diabetic?   yes      Does patient have prosthetic heart valve or mechanical valve?  no  Do you have a pacemaker?  no  Has patient ever had endocarditis? no  Has patient had joint replacement within last 12 months?  no  Is patient constipated or do they take laxatives? no  Does patient have a history of alcohol/drug use?  no  Is patient on blood thinner such as Coumadin, Plavix and/or Aspirin? yes  Medications: simvastatin 20 mg daily, metformin 500 mg daily, losartan 25 mg daily, asa 81 mg daily, zantac daily  Allergies: nkda  Medication Adjustment per Dr Lindi Adie, NP: asa 2 days, hold metformin evening before and morning of procedure  Procedure date & time: 01/20/18 at 730

## 2018-01-11 DIAGNOSIS — M720 Palmar fascial fibromatosis [Dupuytren]: Secondary | ICD-10-CM | POA: Diagnosis not present

## 2018-01-17 ENCOUNTER — Other Ambulatory Visit: Payer: BLUE CROSS/BLUE SHIELD

## 2018-01-20 ENCOUNTER — Encounter (HOSPITAL_COMMUNITY): Payer: Self-pay

## 2018-01-20 ENCOUNTER — Ambulatory Visit (HOSPITAL_COMMUNITY)
Admission: RE | Admit: 2018-01-20 | Discharge: 2018-01-20 | Disposition: A | Discharge status: Home / Self Care | Payer: BLUE CROSS/BLUE SHIELD | Source: Ambulatory Visit | Attending: Internal Medicine | Admitting: Internal Medicine

## 2018-01-20 ENCOUNTER — Other Ambulatory Visit: Payer: Self-pay

## 2018-01-20 ENCOUNTER — Encounter (HOSPITAL_COMMUNITY)
Admission: RE | Disposition: A | Discharge status: Home / Self Care | Payer: Self-pay | Source: Ambulatory Visit | Attending: Internal Medicine

## 2018-01-20 DIAGNOSIS — Z1211 Encounter for screening for malignant neoplasm of colon: Secondary | ICD-10-CM | POA: Insufficient documentation

## 2018-01-20 DIAGNOSIS — Z809 Family history of malignant neoplasm, unspecified: Secondary | ICD-10-CM | POA: Diagnosis not present

## 2018-01-20 DIAGNOSIS — Z79899 Other long term (current) drug therapy: Secondary | ICD-10-CM | POA: Insufficient documentation

## 2018-01-20 DIAGNOSIS — K5730 Diverticulosis of large intestine without perforation or abscess without bleeding: Secondary | ICD-10-CM | POA: Diagnosis not present

## 2018-01-20 DIAGNOSIS — Z8249 Family history of ischemic heart disease and other diseases of the circulatory system: Secondary | ICD-10-CM | POA: Insufficient documentation

## 2018-01-20 DIAGNOSIS — K625 Hemorrhage of anus and rectum: Secondary | ICD-10-CM | POA: Diagnosis not present

## 2018-01-20 DIAGNOSIS — Z823 Family history of stroke: Secondary | ICD-10-CM | POA: Insufficient documentation

## 2018-01-20 DIAGNOSIS — E119 Type 2 diabetes mellitus without complications: Secondary | ICD-10-CM | POA: Diagnosis not present

## 2018-01-20 DIAGNOSIS — Z888 Allergy status to other drugs, medicaments and biological substances status: Secondary | ICD-10-CM | POA: Diagnosis not present

## 2018-01-20 DIAGNOSIS — Z7984 Long term (current) use of oral hypoglycemic drugs: Secondary | ICD-10-CM | POA: Diagnosis not present

## 2018-01-20 DIAGNOSIS — Z800 Family history of malignant neoplasm of digestive organs: Secondary | ICD-10-CM | POA: Diagnosis not present

## 2018-01-20 DIAGNOSIS — D123 Benign neoplasm of transverse colon: Secondary | ICD-10-CM | POA: Insufficient documentation

## 2018-01-20 DIAGNOSIS — K6389 Other specified diseases of intestine: Secondary | ICD-10-CM | POA: Insufficient documentation

## 2018-01-20 DIAGNOSIS — K6289 Other specified diseases of anus and rectum: Secondary | ICD-10-CM | POA: Diagnosis not present

## 2018-01-20 DIAGNOSIS — I10 Essential (primary) hypertension: Secondary | ICD-10-CM | POA: Diagnosis not present

## 2018-01-20 DIAGNOSIS — Z7982 Long term (current) use of aspirin: Secondary | ICD-10-CM | POA: Insufficient documentation

## 2018-01-20 DIAGNOSIS — Z9049 Acquired absence of other specified parts of digestive tract: Secondary | ICD-10-CM | POA: Diagnosis not present

## 2018-01-20 DIAGNOSIS — K633 Ulcer of intestine: Secondary | ICD-10-CM | POA: Diagnosis not present

## 2018-01-20 DIAGNOSIS — K51918 Ulcerative colitis, unspecified with other complication: Secondary | ICD-10-CM | POA: Diagnosis not present

## 2018-01-20 DIAGNOSIS — K598 Other specified functional intestinal disorders: Secondary | ICD-10-CM | POA: Diagnosis not present

## 2018-01-20 HISTORY — PX: COLONOSCOPY: SHX5424

## 2018-01-20 HISTORY — PX: POLYPECTOMY: SHX5525

## 2018-01-20 HISTORY — PX: BIOPSY: SHX5522

## 2018-01-20 LAB — GASTROINTESTINAL PANEL BY PCR, STOOL (REPLACES STOOL CULTURE)
Adenovirus F40/41: NOT DETECTED
Astrovirus: NOT DETECTED
CRYPTOSPORIDIUM: NOT DETECTED
CYCLOSPORA CAYETANENSIS: NOT DETECTED
Campylobacter species: NOT DETECTED
ENTAMOEBA HISTOLYTICA: NOT DETECTED
ENTEROAGGREGATIVE E COLI (EAEC): NOT DETECTED
Enteropathogenic E coli (EPEC): NOT DETECTED
Enterotoxigenic E coli (ETEC): NOT DETECTED
GIARDIA LAMBLIA: NOT DETECTED
NOROVIRUS GI/GII: NOT DETECTED
Plesimonas shigelloides: NOT DETECTED
Rotavirus A: NOT DETECTED
SALMONELLA SPECIES: NOT DETECTED
SAPOVIRUS (I, II, IV, AND V): NOT DETECTED
SHIGELLA/ENTEROINVASIVE E COLI (EIEC): NOT DETECTED
Shiga like toxin producing E coli (STEC): NOT DETECTED
VIBRIO CHOLERAE: NOT DETECTED
Vibrio species: NOT DETECTED
YERSINIA ENTEROCOLITICA: NOT DETECTED

## 2018-01-20 LAB — C DIFFICILE QUICK SCREEN W PCR REFLEX
C DIFFICILE (CDIFF) TOXIN: NEGATIVE
C DIFFICLE (CDIFF) ANTIGEN: NEGATIVE
C Diff interpretation: NOT DETECTED

## 2018-01-20 LAB — GLUCOSE, CAPILLARY: GLUCOSE-CAPILLARY: 103 mg/dL — AB (ref 70–99)

## 2018-01-20 SURGERY — COLONOSCOPY
Anesthesia: Moderate Sedation

## 2018-01-20 MED ORDER — STERILE WATER FOR IRRIGATION IR SOLN
Status: DC | PRN
  Administered 2018-01-20: 08:00:00

## 2018-01-20 MED ORDER — MIDAZOLAM HCL 5 MG/5ML IJ SOLN
INTRAMUSCULAR | Status: AC
  Filled 2018-01-20: qty 10

## 2018-01-20 MED ORDER — MEPERIDINE HCL 50 MG/ML IJ SOLN
INTRAMUSCULAR | Status: DC | PRN
  Administered 2018-01-20 (×2): 25 mg via INTRAVENOUS

## 2018-01-20 MED ORDER — MEPERIDINE HCL 50 MG/ML IJ SOLN
INTRAMUSCULAR | Status: AC
  Filled 2018-01-20: qty 1

## 2018-01-20 MED ORDER — SODIUM CHLORIDE 0.9 % IV SOLN
INTRAVENOUS | Status: DC
  Administered 2018-01-20: 07:00:00 via INTRAVENOUS

## 2018-01-20 MED ORDER — MIDAZOLAM HCL 5 MG/5ML IJ SOLN
INTRAMUSCULAR | Status: DC | PRN
  Administered 2018-01-20: 2 mg via INTRAVENOUS
  Administered 2018-01-20 (×2): 1 mg via INTRAVENOUS
  Administered 2018-01-20 (×2): 2 mg via INTRAVENOUS

## 2018-01-20 NOTE — H&P (Signed)
Autumn Graves is an 52 y.o. female.   Chief Complaint: Patient is here for colonoscopy. HPI: Patient is a 52 year old Caucasian female who is here for screening colonoscopy.  She denies abdominal pain change in bowel habits or rectal bleeding.  This is patient's first exam. Family history is negative for CRC in first-degree relative but positive for colon carcinoma and maternal grandmother who was in her 17s at the time of diagnosis and died 4 years later.  Past Medical History:  Diagnosis Date  . Diabetes ([place])   . Hypertension     Past Surgical History:  Procedure Laterality Date  . CESAREAN SECTION  1990  . CHOLECYSTECTOMY    . GALLBLADDER SURGERY      Family History  Problem Relation Age of Onset  . Cancer Mother   . Heart failure Father   . Stroke Sister   . Colon cancer Maternal Grandmother   . Stroke Maternal Grandfather    Social History:  reports that she has never smoked. She has never used smokeless tobacco. She reports that she does not drink alcohol or use drugs.  Allergies:  Allergies  Allergen Reactions  . Lisinopril Cough  . Topiramate Itching    Medications Prior to Admission  Medication Sig Dispense Refill  . aspirin 81 MG tablet Take 81 mg by mouth daily.    Marland Kitchen ibuprofen (ADVIL,MOTRIN) 200 MG tablet Take 400 mg by mouth 3 (three) times daily as needed for headache or moderate pain.    Marland Kitchen losartan (COZAAR) 25 MG tablet Take 25 mg by mouth every evening.    . metFORMIN (GLUCOPHAGE-XR) 500 MG 24 hr tablet Take 500 mg by mouth daily.  3  . simvastatin (ZOCOR) 20 MG tablet Take 20 mg by mouth daily at 6 PM.   11    No results found for this or any previous visit (from the past 48 hour(s)). No results found.  ROS  Blood pressure (!) 156/83, pulse 83, temperature 98.6 F (37 C), temperature source Oral, resp. rate 20, height 5\' 4"  (1.626 m), weight 84.4 kg, SpO2 96 %. Physical Exam  Constitutional: She appears well-developed and well-nourished.  HENT:   Mouth/Throat: Oropharynx is clear and moist.  Eyes: Conjunctivae are normal.  Neck: No thyromegaly present.  Cardiovascular: Normal rate, regular rhythm and normal heart sounds.  No murmur heard. Respiratory: Effort normal and breath sounds normal.  GI:  Abdomen is symmetrical with Pfannenstiel scar.  Abdomen is soft and nontender with organomegaly or masses.  Musculoskeletal: She exhibits no edema.  Neurological: She is alert.  Skin: Skin is warm and dry.     Assessment/Plan Average or screening colonoscopy.  Hildred Laser, MD 01/20/2018, 7:29 AM

## 2018-01-20 NOTE — Discharge Instructions (Signed)
Colonoscopy, Adult, Care After This sheet gives you information about how to care for yourself after your procedure. Your health care provider may also give you more specific instructions. If you have problems or questions, contact your health care provider. What can I expect after the procedure? After the procedure, it is common to have:  A small amount of blood in your stool for 24 hours after the procedure.  Some gas.  Mild abdominal cramping or bloating.  Follow these instructions at home: General instructions   For the first 24 hours after the procedure: ? Do not drive or use machinery. ? Do not sign important documents. ? Do not drink alcohol. ? Do your regular daily activities at a slower pace than normal. ? Eat soft, easy-to-digest foods. ? Rest often.  Take over-the-counter or prescription medicines only as told by your health care provider.  It is up to you to get the results of your procedure. Ask your health care provider, or the department performing the procedure, when your results will be ready. Relieving cramping and bloating  Try walking around when you have cramps or feel bloated.  Apply heat to your abdomen as told by your health care provider. Use a heat source that your health care provider recommends, such as a moist heat pack or a heating pad. ? Place a towel between your skin and the heat source. ? Leave the heat on for 20-30 minutes. ? Remove the heat if your skin turns bright red. This is especially important if you are unable to feel pain, heat, or cold. You may have a greater risk of getting burned. Eating and drinking  Drink enough fluid to keep your urine clear or pale yellow.  Resume your normal diet as instructed by your health care provider. Avoid heavy or fried foods that are hard to digest.  Avoid drinking alcohol for as long as instructed by your health care provider. Contact a health care provider if:  You have blood in your stool  2-3 days after the procedure. Get help right away if:  You have more than a small spotting of blood in your stool.  You pass large blood clots in your stool.  Your abdomen is swollen.  You have nausea or vomiting.  You have a fever.  You have increasing abdominal pain that is not relieved with medicine. This information is not intended to replace advice given to you by your health care provider. Make sure you discuss any questions you have with your health care provider. Document Released: 01/14/2004 Document Revised: 02/24/2016 Document Reviewed: 08/13/2015 Elsevier Interactive Patient Education  2018 [employer].   Colon Polyps Polyps are tissue growths inside the body. Polyps can grow in many places, including the large intestine (colon). A polyp may be a round bump or a mushroom-shaped growth. You could have one polyp or several. Most colon polyps are noncancerous (benign). However, some colon polyps can become cancerous over time. What are the causes? The exact cause of colon polyps is not known. What increases the risk? This condition is more likely to develop in people who:  Have a family history of colon cancer or colon polyps.  Are older than 13 or older than 45 if they are African American.  Have inflammatory bowel disease, such as ulcerative colitis or Crohn disease.  Are overweight.  Smoke cigarettes.  Do not get enough exercise.  Drink too much alcohol.  Eat a diet that is: ? High in fat and red meat. ?  Low in fiber.  Had childhood cancer that was treated with abdominal radiation.  What are the signs or symptoms? Most polyps do not cause symptoms. If you have symptoms, they may include:  Blood coming from your rectum when having a bowel movement.  Blood in your stool.The stool may look dark red or black.  A change in bowel habits, such as constipation or diarrhea.  How is this diagnosed? This condition is diagnosed with a colonoscopy. This is a  procedure that uses a lighted, flexible scope to look at the inside of your colon. How is this treated? Treatment for this condition involves removing any polyps that are found. Those polyps will then be tested for cancer. If cancer is found, your health care provider will talk to you about options for colon cancer treatment. Follow these instructions at home: Diet  Eat plenty of fiber, such as fruits, vegetables, and whole grains.  Eat foods that are high in calcium and vitamin D, such as milk, cheese, yogurt, eggs, liver, fish, and broccoli.  Limit foods high in fat, red meats, and processed meats, such as hot dogs, sausage, bacon, and lunch meats.  Maintain a healthy weight, or lose weight if recommended by your health care provider. General instructions  Do not smoke cigarettes.  Do not drink alcohol excessively.  Keep all follow-up visits as told by your health care provider. This is important. This includes keeping regularly scheduled colonoscopies. Talk to your health care provider about when you need a colonoscopy.  Exercise every day or as told by your health care provider. Contact a health care provider if:  You have new or worsening bleeding during a bowel movement.  You have new or increased blood in your stool.  You have a change in bowel habits.  You unexpectedly lose weight. This information is not intended to replace advice given to you by your health care provider. Make sure you discuss any questions you have with your health care provider. Document Released: 02/26/2004 Document Revised: 11/07/2015 Document Reviewed: 04/22/2015 Elsevier Interactive Patient Education  [employer].  Diverticulosis Diverticulosis is a condition that develops when small pouches (diverticula) form in the wall of the large intestine (colon). The colon is where water is absorbed and stool is formed. The pouches form when the inside layer of the colon pushes through weak spots in  the outer layers of the colon. You may have a few pouches or many of them. What are the causes? The cause of this condition is not known. What increases the risk? The following factors may make you more likely to develop this condition:  Being older than age 3. Your risk for this condition increases with age. Diverticulosis is rare among people younger than age 32. By age 16, many people have it.  Eating a low-fiber diet.  Having frequent constipation.  Being overweight.  Not getting enough exercise.  Smoking.  Taking over-the-counter pain medicines, like aspirin and ibuprofen.  Having a family history of diverticulosis.  What are the signs or symptoms? In most people, there are no symptoms of this condition. If you do have symptoms, they may include:  Bloating.  Cramps in the abdomen.  Constipation or diarrhea.  Pain in the lower left side of the abdomen.  How is this diagnosed? This condition is most often diagnosed during an exam for other colon problems. Because diverticulosis usually has no symptoms, it often cannot be diagnosed independently. This condition may be diagnosed by:  Using a flexible  scope to examine the colon (colonoscopy).  Taking an X-ray of the colon after dye has been put into the colon (barium enema).  Doing a CT scan.  How is this treated? You may not need treatment for this condition if you have never developed an infection related to diverticulosis. If you have had an infection before, treatment may include:  Eating a high-fiber diet. This may include eating more fruits, vegetables, and grains.  Taking a fiber supplement.  Taking a live bacteria supplement (probiotic).  Taking medicine to relax your colon.  Taking antibiotic medicines.  Follow these instructions at home:  Drink 6-8 glasses of water or more each day to prevent constipation.  Try not to strain when you have a bowel movement.  If you have had an infection  before: ? Eat more fiber as directed by your health care provider or your diet and nutrition specialist (dietitian). ? Take a fiber supplement or probiotic, if your health care provider approves.  Take over-the-counter and prescription medicines only as told by your health care provider.  If you were prescribed an antibiotic, take it as told by your health care provider. Do not stop taking the antibiotic even if you start to feel better.  Keep all follow-up visits as told by your health care provider. This is important. Contact a health care provider if:  You have pain in your abdomen.  You have bloating.  You have cramps.  You have not had a bowel movement in 3 days. Get help right away if:  Your pain gets worse.  Your bloating becomes very bad.  You have a fever or chills, and your symptoms suddenly get worse.  You vomit.  You have bowel movements that are bloody or black.  You have bleeding from your rectum. Summary  Diverticulosis is a condition that develops when small pouches (diverticula) form in the wall of the large intestine (colon).  You may have a few pouches or many of them.  This condition is most often diagnosed during an exam for other colon problems.  If you have had an infection related to diverticulosis, treatment may include increasing the fiber in your diet, taking supplements, or taking medicines. This information is not intended to replace advice given to you by your health care provider. Make sure you discuss any questions you have with your health care provider. Document Released: 02/27/2004 Document Revised: 04/20/2016 Document Reviewed: 04/20/2016 Elsevier Interactive Patient Education  2017 [place]. No ibuprofen or other OTC NSAIDs. Resume aspirin on 01/27/2018. Resume other medications as before. Resume usual diet. No driving for 24 hours. Physician will call with results of biopsy and stool test.

## 2018-01-20 NOTE — Op Note (Signed)
[facility] Patient Name: Autumn Graves Procedure Date: 01/20/2018 7:12 AM MRN: [id number] Date of Birth: [birth year] Attending MD: Hildred Laser , MD CSN: [id number] Age: 52 Admit Type: Outpatient Procedure:                Colonoscopy Indications:              Screening for colorectal malignant neoplasm Providers:                Hildred Laser, MD, Otis Peak B. Sharon Seller, RN, Nelma Rothman, Technician Referring MD:             Manon Hilding MD, MD Medicines:                Meperidine 50 mg IV, Midazolam 8 mg IV Complications:            No immediate complications. Estimated Blood Loss:     Estimated blood loss was minimal. Procedure:                Pre-Anesthesia Assessment:                           - Prior to the procedure, a History and Physical                            was performed, and patient medications and                            allergies were reviewed. The patient's tolerance of                            previous anesthesia was also reviewed. The risks                            and benefits of the procedure and the sedation                            options and risks were discussed with the patient.                            All questions were answered, and informed consent                            was obtained. Prior Anticoagulants: The patient                            last took aspirin 3 days and ibuprofen 2 days prior                            to the procedure. ASA Grade Assessment: II - A                            patient with mild systemic disease. After reviewing  the risks and benefits, the patient was deemed in                            satisfactory condition to undergo the procedure.                           After obtaining informed consent, the colonoscope                            was passed under direct vision. Throughout the                            procedure, the patient's blood pressure,  pulse, and                            oxygen saturations were monitored continuously. The                            PCF-[id number] ([id number]) was introduced through the                            anus and advanced to the the terminal ileum, with                            identification of the appendiceal orifice and IC                            valve. The colonoscopy was performed without                            difficulty. The patient tolerated the procedure                            well. The quality of the bowel preparation was                            good. The terminal ileum, ileocecal valve,                            appendiceal orifice, and rectum were photographed. Scope In: 7:40:27 AM Scope Out: 8:11:52 AM Scope Withdrawal Time: 0 hours 25 minutes 10 seconds  Total Procedure Duration: 0 hours 31 minutes 25 seconds  Findings:      The perianal and digital rectal examinations were normal.      The terminal ileum appeared normal.      A 8 mm polyp was found in the splenic flexure. The polyp was       pedunculated. The polyp was removed with a hot snare. Resection and       retrieval were complete. To stop active bleeding, two hemostatic clips       were successfully placed (MR conditional). There was no bleeding at the       end of the procedure.      A single (solitary) ten mm ulcer was found at the splenic flexure. No  bleeding was present. Biopsies were taken with a cold forceps for       histology. The pathology specimen was placed into Bottle Number 2.      An area of eroded and friable (with contact bleeding) mucosa was found       in the rectum and in the sigmoid colon. The pathology specimen was       placed into Bottle Number 1. The pathology specimen was placed into       Bottle Number 4.      A few small-mouthed diverticula were found in the sigmoid colon and       splenic flexure.      The retroflexed view of the distal rectum and anal verge was normal and        showed no anal or rectal abnormalities.      Stool sample obtained Fluid aspiration was performed through the scope       suction channel. The amount of fluid collected was 30 mL. Sample(s) were       sent for GI path and c.difficile testing. Impression:               - The examined portion of the ileum was normal.                           - One 8 mm polyp at the splenic flexure, removed                            with a hot snare. Resected and retrieved. Clips (MR                            conditional) were placed to stop active bleeding.                           - A single (solitary) ulcer at the splenic flexure.                            Biopsied.                           - Eroded and friable (with contact bleeding) mucosa                            in the rectum and in the sigmoid colon.                           - Diverticulosis in the sigmoid colon and at the                            splenic flexure.                           - Stool sample obtained for studies. Moderate Sedation:      Moderate (conscious) sedation was administered by the endoscopy nurse       and supervised by the endoscopist. The following parameters were       monitored: oxygen saturation, heart rate, blood pressure, CO2       capnography and  response to care. Total physician intraservice time was       42 minutes. Recommendation:           - Patient has a contact number available for                            emergencies. The signs and symptoms of potential                            delayed complications were discussed with the                            patient. Return to normal activities tomorrow.                            Written discharge instructions were provided to the                            patient.                           - Resume previous diet today.                           - Continue present medications.                           - No OTC NSAIDs.                           -  Resume aspirin at prior dose in 1 week.                           - Await pathology results.                           - Repeat colonoscopy is recommended. The                            colonoscopy date will be determined after pathology                            results from today's exam become available for                            review. Procedure Code(s):        --- Professional ---                           [phone], Colonoscopy, flexible; with removal of                            tumor(s), polyp(s), or other lesion(s) by snare                            technique  63846, 59, Colonoscopy, flexible; with biopsy,                            single or multiple                           G0500, Moderate sedation services provided by the                            same physician or other qualified health care                            professional performing a gastrointestinal                            endoscopic service that sedation supports,                            requiring the presence of an independent trained                            observer to assist in the monitoring of the                            patient's level of consciousness and physiological                            status; initial 15 minutes of intra-service time;                            patient age 32 years or older (additional time may                            be reported with [phone], as appropriate)                           [phone], Moderate sedation services provided by the                            same physician or other qualified health care                            professional performing the diagnostic or                            therapeutic service that the sedation supports,                            requiring the presence of an independent trained                            observer to assist in the monitoring of the                            patient's  level of  consciousness and physiological                            status; each additional 15 minutes intraservice                            time (List separately in addition to code for                            primary service)                           [phone], Moderate sedation services provided by the                            same physician or other qualified health care                            professional performing the diagnostic or                            therapeutic service that the sedation supports,                            requiring the presence of an independent trained                            observer to assist in the monitoring of the                            patient's level of consciousness and physiological                            status; each additional 15 minutes intraservice                            time (List separately in addition to code for                            primary service) Diagnosis Code(s):        --- Professional ---                           Z12.11, Encounter for screening for malignant                            neoplasm of colon                           D12.3, Benign neoplasm of transverse colon (hepatic                            flexure or splenic flexure)                           K63.3, Ulcer  of intestine                           K62.89, Other specified diseases of anus and rectum                           K63.89, Other specified diseases of intestine                           K62.5, Hemorrhage of anus and rectum                           K92.2, Gastrointestinal hemorrhage, unspecified                           K57.30, Diverticulosis of large intestine without                            perforation or abscess without bleeding CPT copyright 2017 [employer]. All rights reserved. The codes documented in this report are preliminary and upon coder review may  be revised to meet current compliance requirements. Hildred Laser, MD Hildred Laser, MD 01/20/2018 8:26:25 AM This report has been signed electronically. Number of Addenda: 0

## 2018-01-25 ENCOUNTER — Encounter (HOSPITAL_COMMUNITY): Payer: Self-pay | Admitting: Internal Medicine

## 2018-02-07 DIAGNOSIS — M25579 Pain in unspecified ankle and joints of unspecified foot: Secondary | ICD-10-CM | POA: Diagnosis not present

## 2018-02-07 DIAGNOSIS — M79672 Pain in left foot: Secondary | ICD-10-CM | POA: Diagnosis not present

## 2018-02-17 DIAGNOSIS — I10 Essential (primary) hypertension: Secondary | ICD-10-CM | POA: Diagnosis not present

## 2018-02-17 DIAGNOSIS — E782 Mixed hyperlipidemia: Secondary | ICD-10-CM | POA: Diagnosis not present

## 2018-02-17 DIAGNOSIS — R739 Hyperglycemia, unspecified: Secondary | ICD-10-CM | POA: Diagnosis not present

## 2018-02-17 DIAGNOSIS — E1165 Type 2 diabetes mellitus with hyperglycemia: Secondary | ICD-10-CM | POA: Diagnosis not present

## 2018-02-21 DIAGNOSIS — Z1389 Encounter for screening for other disorder: Secondary | ICD-10-CM | POA: Diagnosis not present

## 2018-02-21 DIAGNOSIS — Z23 Encounter for immunization: Secondary | ICD-10-CM | POA: Diagnosis not present

## 2018-02-21 DIAGNOSIS — E1140 Type 2 diabetes mellitus with diabetic neuropathy, unspecified: Secondary | ICD-10-CM | POA: Diagnosis not present

## 2018-02-21 DIAGNOSIS — I10 Essential (primary) hypertension: Secondary | ICD-10-CM | POA: Diagnosis not present

## 2018-02-21 DIAGNOSIS — Z1331 Encounter for screening for depression: Secondary | ICD-10-CM | POA: Diagnosis not present

## 2018-02-21 DIAGNOSIS — E782 Mixed hyperlipidemia: Secondary | ICD-10-CM | POA: Diagnosis not present

## 2018-02-21 DIAGNOSIS — M1612 Unilateral primary osteoarthritis, left hip: Secondary | ICD-10-CM | POA: Diagnosis not present

## 2018-02-28 DIAGNOSIS — M25579 Pain in unspecified ankle and joints of unspecified foot: Secondary | ICD-10-CM | POA: Diagnosis not present

## 2018-02-28 DIAGNOSIS — M79672 Pain in left foot: Secondary | ICD-10-CM | POA: Diagnosis not present

## 2018-02-28 DIAGNOSIS — M79671 Pain in right foot: Secondary | ICD-10-CM | POA: Diagnosis not present

## 2018-04-04 DIAGNOSIS — M25579 Pain in unspecified ankle and joints of unspecified foot: Secondary | ICD-10-CM | POA: Diagnosis not present

## 2018-04-04 DIAGNOSIS — M79672 Pain in left foot: Secondary | ICD-10-CM | POA: Diagnosis not present

## 2018-04-11 DIAGNOSIS — B085 Enteroviral vesicular pharyngitis: Secondary | ICD-10-CM | POA: Diagnosis not present

## 2018-04-11 DIAGNOSIS — Z6831 Body mass index (BMI) 31.0-31.9, adult: Secondary | ICD-10-CM | POA: Diagnosis not present

## 2018-04-27 DIAGNOSIS — Z6831 Body mass index (BMI) 31.0-31.9, adult: Secondary | ICD-10-CM | POA: Diagnosis not present

## 2018-04-27 DIAGNOSIS — R300 Dysuria: Secondary | ICD-10-CM | POA: Diagnosis not present

## 2018-04-27 DIAGNOSIS — N760 Acute vaginitis: Secondary | ICD-10-CM | POA: Diagnosis not present

## 2018-05-05 ENCOUNTER — Ambulatory Visit (INDEPENDENT_AMBULATORY_CARE_PROVIDER_SITE_OTHER): Payer: BLUE CROSS/BLUE SHIELD | Admitting: Internal Medicine

## 2018-05-05 ENCOUNTER — Encounter (INDEPENDENT_AMBULATORY_CARE_PROVIDER_SITE_OTHER): Payer: Self-pay | Admitting: Internal Medicine

## 2018-05-05 VITALS — BP 160/80 | HR 84 | Temp 97.4°F | Ht 64.0 in | Wt 178.4 lb

## 2018-05-05 DIAGNOSIS — K5289 Other specified noninfective gastroenteritis and colitis: Secondary | ICD-10-CM

## 2018-05-05 DIAGNOSIS — K219 Gastro-esophageal reflux disease without esophagitis: Secondary | ICD-10-CM

## 2018-05-05 NOTE — Patient Instructions (Signed)
Gastroesophageal Reflux Disease, Adult Normally, food travels down the esophagus and stays in the stomach to be digested. However, when a person has gastroesophageal reflux disease (GERD), food and stomach acid move back up into the esophagus. When this happens, the esophagus becomes sore and inflamed. Over time, GERD can create small holes (ulcers) in the lining of the esophagus. What are the causes? This condition is caused by a problem with the muscle between the esophagus and the stomach (lower esophageal sphincter, or LES). Normally, the LES muscle closes after food passes through the esophagus to the stomach. When the LES is weakened or abnormal, it does not close properly, and that allows food and stomach acid to go back up into the esophagus. The LES can be weakened by certain dietary substances, medicines, and medical conditions, including:  Tobacco use.  Pregnancy.  Having a hiatal hernia.  Heavy alcohol use.  Certain foods and beverages, such as coffee, chocolate, onions, and peppermint.  What increases the risk? This condition is more likely to develop in:  People who have an increased body weight.  People who have connective tissue disorders.  People who use NSAID medicines.  What are the signs or symptoms? Symptoms of this condition include:  Heartburn.  Difficult or painful swallowing.  The feeling of having a lump in the throat.  Abitter taste in the mouth.  Bad breath.  Having a large amount of saliva.  Having an upset or bloated stomach.  Belching.  Chest pain.  Shortness of breath or wheezing.  Ongoing (chronic) cough or a night-time cough.  Wearing away of tooth enamel.  Weight loss.  Different conditions can cause chest pain. Make sure to see your health care provider if you experience chest pain. How is this diagnosed? Your health care provider will take a medical history and perform a physical exam. To determine if you have mild or severe  GERD, your health care provider may also monitor how you respond to treatment. You may also have other tests, including:  An endoscopy toexamine your stomach and esophagus with a small camera.  A test thatmeasures the acidity level in your esophagus.  A test thatmeasures how much pressure is on your esophagus.  A barium swallow or modified barium swallow to show the shape, size, and functioning of your esophagus.  How is this treated? The goal of treatment is to help relieve your symptoms and to prevent complications. Treatment for this condition may vary depending on how severe your symptoms are. Your health care provider may recommend:  Changes to your diet.  Medicine.  Surgery.  Follow these instructions at home: Diet  Follow a diet as recommended by your health care provider. This may involve avoiding foods and drinks such as: ? Coffee and tea (with or without caffeine). ? Drinks that containalcohol. ? Energy drinks and sports drinks. ? Carbonated drinks or sodas. ? Chocolate and cocoa. ? Peppermint and mint flavorings. ? Garlic and onions. ? Horseradish. ? Spicy and acidic foods, including peppers, chili powder, curry powder, vinegar, hot sauces, and barbecue sauce. ? Citrus fruit juices and citrus fruits, such as oranges, lemons, and limes. ? Tomato-based foods, such as red sauce, chili, salsa, and pizza with red sauce. ? Fried and fatty foods, such as donuts, french fries, potato chips, and high-fat dressings. ? High-fat meats, such as hot dogs and fatty cuts of red and white meats, such as rib eye steak, sausage, ham, and bacon. ? High-fat dairy items, such as whole milk,   butter, and cream cheese.  Eat small, frequent meals instead of large meals.  Avoid drinking large amounts of liquid with your meals.  Avoid eating meals during the 2-3 hours before bedtime.  Avoid lying down right after you eat.  Do not exercise right after you eat. General  instructions  Pay attention to any changes in your symptoms.  Take over-the-counter and prescription medicines only as told by your health care provider. Do not take aspirin, ibuprofen, or other NSAIDs unless your health care provider told you to do so.  Do not use any tobacco products, including cigarettes, chewing tobacco, and e-cigarettes. If you need help quitting, ask your health care provider.  Wear loose-fitting clothing. Do not wear anything tight around your waist that causes pressure on your abdomen.  Raise (elevate) the head of your bed 6 inches (15cm).  Try to reduce your stress, such as with yoga or meditation. If you need help reducing stress, ask your health care provider.  If you are overweight, reduce your weight to an amount that is healthy for you. Ask your health care provider for guidance about a safe weight loss goal.  Keep all follow-up visits as told by your health care provider. This is important. Contact a health care provider if:  You have new symptoms.  You have unexplained weight loss.  You have difficulty swallowing, or it hurts to swallow.  You have wheezing or a persistent cough.  Your symptoms do not improve with treatment.  You have a hoarse voice. Get help right away if:  You have pain in your arms, neck, jaw, teeth, or back.  You feel sweaty, dizzy, or light-headed.  You have chest pain or shortness of breath.  You vomit and your vomit looks like blood or coffee grounds.  You faint.  Your stool is bloody or black.  You cannot swallow, drink, or eat. This information is not intended to replace advice given to you by your health care provider. Make sure you discuss any questions you have with your health care provider. Document Released: 03/11/2005 Document Revised: 10/30/2015 Document Reviewed: 09/26/2014 Elsevier Interactive Patient Education  2018 Elsevier Inc.  

## 2018-05-05 NOTE — Progress Notes (Signed)
Subjective:    Patient ID: Autumn Graves, female    DOB: 27-Feb-1966, 52 y.o.   MRN: [id number]  HPI Here today for f/u. Underwent a colonoscopy in August of this year (screening).                            - One 8 mm polyp at the splenic flexure, removed                            with a hot snare. Resected and retrieved. Clips (MR                            conditional) were placed to stop active bleeding.                           - A single (solitary) ulcer at the splenic flexure.                            Biopsied.                           - Eroded and friable (with contact bleeding) mucosa                            in the rectum and in the sigmoid colon.                           - Diverticulosis in the sigmoid colon and at the                            splenic flexure.                           - Stool sample obtained for studies. Biopsy: Tubular adenoma.  Probably NSAID colitis or self limiting infection.  She tells me she had been taking Advil 2-3 twice a day after she had hand surgery. She has not taken any since. She tells me she is doing.  No rectal pain. Appetite is good. No weight loss. BMs are normal.  Review of Systems Past Medical History:  Diagnosis Date  . Diabetes ([place])   . Hypertension     Past Surgical History:  Procedure Laterality Date  . BIOPSY  01/20/2018   Procedure: BIOPSY;  Surgeon: Rogene Houston, MD;  [facility];  Service: Endoscopy;;  colon  . CESAREAN SECTION  1990  . CHOLECYSTECTOMY    . COLONOSCOPY N/A 01/20/2018   Procedure: COLONOSCOPY;  Surgeon: Rogene Houston, MD;  [facility];  Service: Endoscopy;  Laterality: N/A;  730  . GALLBLADDER SURGERY    . POLYPECTOMY  01/20/2018   Procedure: POLYPECTOMY;  Surgeon: Rogene Houston, MD;  [facility];  Service: Endoscopy;;  colon    Allergies  Allergen Reactions  . Lisinopril Cough  . Topiramate Itching    Current Outpatient Medications on File  Prior to Visit  Medication Sig Dispense Refill  . aspirin 81 MG tablet Take 1 tablet (81 mg total) by mouth daily. 30 tablet   .  famotidine (PEPCID) 20 MG tablet Take 20 mg by mouth 2 (two) times daily.    Marland Kitchen losartan (COZAAR) 25 MG tablet Take 25 mg by mouth every evening.    . metFORMIN (GLUCOPHAGE-XR) 500 MG 24 hr tablet Take 500 mg by mouth daily.  3  . simvastatin (ZOCOR) 20 MG tablet Take 20 mg by mouth daily at 6 PM.   11   No current facility-administered medications on file prior to visit.         Objective:   Physical Exam Blood pressure (!) 160/80, pulse 84, temperature (!) 97.4 F (36.3 C), height 5\' 4"  (1.626 m), weight 178 lb 6.4 oz (80.9 kg). Alert and oriented. Skin warm and dry. Oral mucosa is moist.   . Sclera anicteric, conjunctivae is pink. Thyroid not enlarged. No cervical lymphadenopathy. Lungs clear. Heart regular rate and rhythm.  Abdomen is soft. Bowel sounds are positive. No hepatomegaly. No abdominal masses felt. No tenderness.  No edema to lower extremities.           Assessment & Plan:  NSAID colitis: She is doing much better. Has not been taking any Advil. GERD: Take the Pepcid twice a day. GERD diet given to patient.

## 2018-07-04 DIAGNOSIS — M79674 Pain in right toe(s): Secondary | ICD-10-CM | POA: Diagnosis not present

## 2018-07-04 DIAGNOSIS — M79671 Pain in right foot: Secondary | ICD-10-CM | POA: Diagnosis not present

## 2018-07-04 DIAGNOSIS — L03031 Cellulitis of right toe: Secondary | ICD-10-CM | POA: Diagnosis not present

## 2018-07-04 DIAGNOSIS — L600 Ingrowing nail: Secondary | ICD-10-CM | POA: Diagnosis not present

## 2018-07-18 DIAGNOSIS — M79672 Pain in left foot: Secondary | ICD-10-CM | POA: Diagnosis not present

## 2018-07-18 DIAGNOSIS — M722 Plantar fascial fibromatosis: Secondary | ICD-10-CM | POA: Diagnosis not present

## 2018-07-25 DIAGNOSIS — R110 Nausea: Secondary | ICD-10-CM | POA: Diagnosis not present

## 2018-07-25 DIAGNOSIS — R509 Fever, unspecified: Secondary | ICD-10-CM | POA: Diagnosis not present

## 2018-07-25 DIAGNOSIS — Z6830 Body mass index (BMI) 30.0-30.9, adult: Secondary | ICD-10-CM | POA: Diagnosis not present

## 2018-07-25 DIAGNOSIS — K529 Noninfective gastroenteritis and colitis, unspecified: Secondary | ICD-10-CM | POA: Diagnosis not present

## 2018-08-01 DIAGNOSIS — M79671 Pain in right foot: Secondary | ICD-10-CM | POA: Diagnosis not present

## 2018-08-01 DIAGNOSIS — M79674 Pain in right toe(s): Secondary | ICD-10-CM | POA: Diagnosis not present

## 2018-08-01 DIAGNOSIS — L600 Ingrowing nail: Secondary | ICD-10-CM | POA: Diagnosis not present

## 2019-01-25 DIAGNOSIS — E1165 Type 2 diabetes mellitus with hyperglycemia: Secondary | ICD-10-CM | POA: Diagnosis not present

## 2019-01-25 DIAGNOSIS — E7800 Pure hypercholesterolemia, unspecified: Secondary | ICD-10-CM | POA: Diagnosis not present

## 2019-01-25 DIAGNOSIS — E782 Mixed hyperlipidemia: Secondary | ICD-10-CM | POA: Diagnosis not present

## 2019-01-25 DIAGNOSIS — E1140 Type 2 diabetes mellitus with diabetic neuropathy, unspecified: Secondary | ICD-10-CM | POA: Diagnosis not present

## 2019-01-30 DIAGNOSIS — E782 Mixed hyperlipidemia: Secondary | ICD-10-CM | POA: Diagnosis not present

## 2019-01-30 DIAGNOSIS — G629 Polyneuropathy, unspecified: Secondary | ICD-10-CM | POA: Diagnosis not present

## 2019-01-30 DIAGNOSIS — E1140 Type 2 diabetes mellitus with diabetic neuropathy, unspecified: Secondary | ICD-10-CM | POA: Diagnosis not present

## 2019-01-30 DIAGNOSIS — F411 Generalized anxiety disorder: Secondary | ICD-10-CM | POA: Diagnosis not present

## 2019-05-25 DIAGNOSIS — E7800 Pure hypercholesterolemia, unspecified: Secondary | ICD-10-CM | POA: Diagnosis not present

## 2019-05-25 DIAGNOSIS — E1165 Type 2 diabetes mellitus with hyperglycemia: Secondary | ICD-10-CM | POA: Diagnosis not present

## 2019-05-25 DIAGNOSIS — E782 Mixed hyperlipidemia: Secondary | ICD-10-CM | POA: Diagnosis not present

## 2019-05-25 DIAGNOSIS — E1140 Type 2 diabetes mellitus with diabetic neuropathy, unspecified: Secondary | ICD-10-CM | POA: Diagnosis not present

## 2019-05-30 DIAGNOSIS — I10 Essential (primary) hypertension: Secondary | ICD-10-CM | POA: Diagnosis not present

## 2019-05-30 DIAGNOSIS — E1140 Type 2 diabetes mellitus with diabetic neuropathy, unspecified: Secondary | ICD-10-CM | POA: Diagnosis not present

## 2019-05-30 DIAGNOSIS — Z1331 Encounter for screening for depression: Secondary | ICD-10-CM | POA: Diagnosis not present

## 2019-05-30 DIAGNOSIS — G629 Polyneuropathy, unspecified: Secondary | ICD-10-CM | POA: Diagnosis not present

## 2019-05-30 DIAGNOSIS — F411 Generalized anxiety disorder: Secondary | ICD-10-CM | POA: Diagnosis not present

## 2019-05-30 DIAGNOSIS — Z23 Encounter for immunization: Secondary | ICD-10-CM | POA: Diagnosis not present

## 2019-05-30 DIAGNOSIS — Z1389 Encounter for screening for other disorder: Secondary | ICD-10-CM | POA: Diagnosis not present

## 2019-09-22 DIAGNOSIS — I10 Essential (primary) hypertension: Secondary | ICD-10-CM | POA: Diagnosis not present

## 2019-09-22 DIAGNOSIS — E1140 Type 2 diabetes mellitus with diabetic neuropathy, unspecified: Secondary | ICD-10-CM | POA: Diagnosis not present

## 2019-09-22 DIAGNOSIS — E782 Mixed hyperlipidemia: Secondary | ICD-10-CM | POA: Diagnosis not present

## 2019-09-27 DIAGNOSIS — F411 Generalized anxiety disorder: Secondary | ICD-10-CM | POA: Diagnosis not present

## 2019-09-27 DIAGNOSIS — G629 Polyneuropathy, unspecified: Secondary | ICD-10-CM | POA: Diagnosis not present

## 2019-09-27 DIAGNOSIS — E1140 Type 2 diabetes mellitus with diabetic neuropathy, unspecified: Secondary | ICD-10-CM | POA: Diagnosis not present

## 2019-09-27 DIAGNOSIS — E782 Mixed hyperlipidemia: Secondary | ICD-10-CM | POA: Diagnosis not present

## 2019-10-11 DIAGNOSIS — Z23 Encounter for immunization: Secondary | ICD-10-CM | POA: Diagnosis not present

## 2019-11-08 DIAGNOSIS — Z23 Encounter for immunization: Secondary | ICD-10-CM | POA: Diagnosis not present

## 2019-12-06 DIAGNOSIS — M79671 Pain in right foot: Secondary | ICD-10-CM | POA: Diagnosis not present

## 2019-12-06 DIAGNOSIS — M722 Plantar fascial fibromatosis: Secondary | ICD-10-CM | POA: Diagnosis not present

## 2019-12-08 DIAGNOSIS — R300 Dysuria: Secondary | ICD-10-CM | POA: Diagnosis not present

## 2019-12-27 DIAGNOSIS — M79671 Pain in right foot: Secondary | ICD-10-CM | POA: Diagnosis not present

## 2019-12-27 DIAGNOSIS — M722 Plantar fascial fibromatosis: Secondary | ICD-10-CM | POA: Diagnosis not present

## 2020-01-16 DIAGNOSIS — E7800 Pure hypercholesterolemia, unspecified: Secondary | ICD-10-CM | POA: Diagnosis not present

## 2020-01-16 DIAGNOSIS — E782 Mixed hyperlipidemia: Secondary | ICD-10-CM | POA: Diagnosis not present

## 2020-01-16 DIAGNOSIS — E1165 Type 2 diabetes mellitus with hyperglycemia: Secondary | ICD-10-CM | POA: Diagnosis not present

## 2020-01-16 DIAGNOSIS — E1140 Type 2 diabetes mellitus with diabetic neuropathy, unspecified: Secondary | ICD-10-CM | POA: Diagnosis not present

## 2020-01-22 DIAGNOSIS — M722 Plantar fascial fibromatosis: Secondary | ICD-10-CM | POA: Diagnosis not present

## 2020-01-22 DIAGNOSIS — E1140 Type 2 diabetes mellitus with diabetic neuropathy, unspecified: Secondary | ICD-10-CM | POA: Diagnosis not present

## 2020-01-22 DIAGNOSIS — G629 Polyneuropathy, unspecified: Secondary | ICD-10-CM | POA: Diagnosis not present

## 2020-01-22 DIAGNOSIS — F411 Generalized anxiety disorder: Secondary | ICD-10-CM | POA: Diagnosis not present

## 2020-01-22 DIAGNOSIS — M79672 Pain in left foot: Secondary | ICD-10-CM | POA: Diagnosis not present

## 2020-01-22 DIAGNOSIS — E782 Mixed hyperlipidemia: Secondary | ICD-10-CM | POA: Diagnosis not present

## 2020-02-26 DIAGNOSIS — M79672 Pain in left foot: Secondary | ICD-10-CM | POA: Diagnosis not present

## 2020-02-26 DIAGNOSIS — M722 Plantar fascial fibromatosis: Secondary | ICD-10-CM | POA: Diagnosis not present

## 2023-01-04 ENCOUNTER — Encounter (INDEPENDENT_AMBULATORY_CARE_PROVIDER_SITE_OTHER): Payer: Self-pay | Admitting: *Deleted

## 2023-02-08 ENCOUNTER — Telehealth (INDEPENDENT_AMBULATORY_CARE_PROVIDER_SITE_OTHER): Payer: Self-pay | Admitting: *Deleted

## 2023-02-08 NOTE — Telephone Encounter (Signed)
Referring MD/PCP: [employer]  Insurance: [employer]  Best Phone Number: [phone] or [phone]  Reason for the colonoscopy hx polyps, fam hx colon ca - 5 yr TCS recall  Has patient had this procedure before?  Yes, 2019  If so, when, by whom and where?    Is there a family history of colon cancer?  Yes, grandmother  Who?  What age when diagnosed?    Is patient diabetic? If yes, Type 1 or Type 2   yes, type 2      Does patient have prosthetic heart valve or mechanical valve?  no  Do you have a pacemaker/defibrillator?  no  Has patient ever had endocarditis/atrial fibrillation? no  Has patient had joint replacement within last 12 months?  no  Is patient constipated or do they take laxatives? no  Does patient have a history of alcohol/drug use?  no  Does patient use oxygen? no  Have you had a stroke/heart attack last 6 mths? no  Do you take medicine for weight loss?  no  For female patients,: have you had a hysterectomy no                      are you post menopausal yes                      do you still have your menstrual cycle no  Do you take any blood-thinning medications such as: (aspirin, warfarin, Plavix, Aggrenox)  no  If yes we need the name, milligram, dosage and who is prescribing doctor   Medications: metformin 500 mg 3 daily, gabapentin 300 mg daily, simvastatin 20 mg daily, losartan 25 mg daily, ozempic 0.5 mg once a week  Allergies: lisinopril, topiramate  Pharmacy: [facility]

## 2023-02-09 NOTE — Telephone Encounter (Signed)
 Ok to schedule.  Room 2  Thanks,  Vista Lawman, MD Gastroenterology and Hepatology [facility]

## 2023-02-18 NOTE — Telephone Encounter (Signed)
Will call once we get Oct schedule

## 2023-03-04 ENCOUNTER — Encounter (INDEPENDENT_AMBULATORY_CARE_PROVIDER_SITE_OTHER): Payer: Self-pay

## 2023-03-04 MED ORDER — PEG 3350-KCL-NA BICARB-NACL 420 G PO SOLR: 4000.0000 mL | Freq: Once | ORAL | 0 refills | Status: AC

## 2023-03-04 NOTE — Telephone Encounter (Signed)
Questionnaire from recall, no referral needed  

## 2023-03-04 NOTE — Addendum Note (Signed)
Addended by: Marlowe Shores on: 03/04/2023 10:37 AM   Modules accepted: Orders

## 2023-03-04 NOTE — Telephone Encounter (Signed)
Pt contacted and scheduled for 04/08/23 at 8:30am. Prep sent to pharmacy. Instructions will be picked up. Will mail letter with pre op. No PA needed via insurance.

## 2023-04-06 ENCOUNTER — Encounter (HOSPITAL_COMMUNITY)
Admission: RE | Admit: 2023-04-06 | Discharge: 2023-04-06 | Disposition: A | Discharge status: Home / Self Care | Payer: BC Managed Care – PPO | Source: Ambulatory Visit | Attending: Gastroenterology | Admitting: Gastroenterology

## 2023-04-06 ENCOUNTER — Telehealth (INDEPENDENT_AMBULATORY_CARE_PROVIDER_SITE_OTHER): Payer: Self-pay | Admitting: Gastroenterology

## 2023-04-06 NOTE — Progress Notes (Signed)
Called patient for her PAT call and discovered that she has been taking Ozempic.  This medication was not listed on her PTA list.  Her medications were reviewed and I entered her current Ozempic dosage.  Office notified and will reschedule this procedure.

## 2023-04-06 NOTE — Telephone Encounter (Signed)
Autumn [facility] from [facility] called and states that they just had phone call with pt and she is on Ozempic and took it Friday so they would need to cancel TCS on 04/08/23. Contacted pt and advised her we need to reschedule due to Ozempic. Pt did put Ozempic on triage form but I did not place that on her instructions to hold for 7 days due to overlooking. Pt verbalized understanding. Pt has been rescheduled to 04/27/23 at 7:30am. Pt does not need prep. Will send updated instructions to pt via mail.

## 2023-04-08 LAB — HM COLONOSCOPY

## 2023-04-09 ENCOUNTER — Encounter (INDEPENDENT_AMBULATORY_CARE_PROVIDER_SITE_OTHER): Payer: Self-pay | Admitting: *Deleted

## 2023-04-26 NOTE — Anesthesia Preprocedure Evaluation (Signed)
Anesthesia Evaluation  Patient identified by MRN, date of birth, ID band Patient awake    Reviewed: Allergy & Precautions, H&P , NPO status , Patient's Chart, lab work & pertinent test results, reviewed documented beta blocker date and time   Airway Mallampati: II  TM Distance: >3 FB Neck ROM: full    Dental no notable dental hx.    Pulmonary neg pulmonary ROS          Cardiovascular Exercise Tolerance: Good hypertension,      Neuro/Psych negative neurological ROS  negative psych ROS   GI/Hepatic negative GI ROS, Neg liver ROS,,,  Endo/Other  diabetes, Type 2    Renal/GU negative Renal ROS  negative genitourinary   Musculoskeletal   Abdominal   Peds  Hematology negative hematology ROS (+)   Anesthesia Other Findings   Reproductive/Obstetrics negative OB ROS                             Anesthesia Physical Anesthesia Plan  ASA: 2  Anesthesia Plan: General   Post-op Pain Management: Minimal or no pain anticipated   Induction: Intravenous  PONV Risk Score and Plan: Propofol infusion  Airway Management Planned: Natural Airway and Nasal Cannula  Additional Equipment: None  Intra-op Plan:   Post-operative Plan: Post-operative intubation/ventilation  Informed Consent: I have reviewed the patients History and Physical, chart, labs and discussed the procedure including the risks, benefits and alternatives for the proposed anesthesia with the patient or authorized representative who has indicated his/her understanding and acceptance.     Dental Advisory Given  Plan Discussed with: CRNA  Anesthesia Plan Comments:         Anesthesia Quick Evaluation

## 2023-04-27 ENCOUNTER — Ambulatory Visit (HOSPITAL_COMMUNITY): Payer: BC Managed Care – PPO | Admitting: Anesthesiology

## 2023-04-27 ENCOUNTER — Encounter (HOSPITAL_COMMUNITY): Payer: Self-pay

## 2023-04-27 ENCOUNTER — Ambulatory Visit (HOSPITAL_COMMUNITY)
Admission: RE | Admit: 2023-04-27 | Discharge: 2023-04-27 | Disposition: A | Discharge status: Home / Self Care | Payer: BC Managed Care – PPO | Attending: Gastroenterology | Admitting: Gastroenterology

## 2023-04-27 ENCOUNTER — Other Ambulatory Visit: Payer: Self-pay

## 2023-04-27 ENCOUNTER — Ambulatory Visit (HOSPITAL_COMMUNITY): Payer: Self-pay | Admitting: Anesthesiology

## 2023-04-27 ENCOUNTER — Encounter (HOSPITAL_COMMUNITY)
Admission: RE | Disposition: A | Discharge status: Home / Self Care | Payer: Self-pay | Source: Home / Self Care | Attending: Gastroenterology

## 2023-04-27 DIAGNOSIS — D120 Benign neoplasm of cecum: Secondary | ICD-10-CM | POA: Diagnosis not present

## 2023-04-27 DIAGNOSIS — E119 Type 2 diabetes mellitus without complications: Secondary | ICD-10-CM | POA: Insufficient documentation

## 2023-04-27 DIAGNOSIS — K635 Polyp of colon: Secondary | ICD-10-CM

## 2023-04-27 DIAGNOSIS — K648 Other hemorrhoids: Secondary | ICD-10-CM | POA: Insufficient documentation

## 2023-04-27 DIAGNOSIS — K5730 Diverticulosis of large intestine without perforation or abscess without bleeding: Secondary | ICD-10-CM | POA: Diagnosis not present

## 2023-04-27 DIAGNOSIS — D122 Benign neoplasm of ascending colon: Secondary | ICD-10-CM | POA: Diagnosis not present

## 2023-04-27 DIAGNOSIS — I10 Essential (primary) hypertension: Secondary | ICD-10-CM | POA: Insufficient documentation

## 2023-04-27 DIAGNOSIS — D125 Benign neoplasm of sigmoid colon: Secondary | ICD-10-CM | POA: Insufficient documentation

## 2023-04-27 DIAGNOSIS — Z1211 Encounter for screening for malignant neoplasm of colon: Secondary | ICD-10-CM | POA: Insufficient documentation

## 2023-04-27 DIAGNOSIS — K644 Residual hemorrhoidal skin tags: Secondary | ICD-10-CM | POA: Insufficient documentation

## 2023-04-27 DIAGNOSIS — Z800 Family history of malignant neoplasm of digestive organs: Secondary | ICD-10-CM | POA: Diagnosis not present

## 2023-04-27 HISTORY — DX: Other specified postprocedural states: Z98.890

## 2023-04-27 HISTORY — PX: POLYPECTOMY: SHX5525

## 2023-04-27 HISTORY — PX: COLONOSCOPY WITH PROPOFOL: SHX5780

## 2023-04-27 LAB — HM COLONOSCOPY

## 2023-04-27 LAB — GLUCOSE, CAPILLARY: Glucose-Capillary: 141 mg/dL — ABNORMAL HIGH (ref 70–99)

## 2023-04-27 SURGERY — COLONOSCOPY WITH PROPOFOL
Anesthesia: General

## 2023-04-27 MED ORDER — PROPOFOL 500 MG/50ML IV EMUL
INTRAVENOUS | Status: DC | PRN
  Administered 2023-04-27: 150 ug/kg/min via INTRAVENOUS

## 2023-04-27 MED ORDER — LIDOCAINE HCL (CARDIAC) PF 100 MG/5ML IV SOSY
PREFILLED_SYRINGE | INTRAVENOUS | Status: DC | PRN
  Administered 2023-04-27: 50 mg via INTRAVENOUS

## 2023-04-27 MED ORDER — LACTATED RINGERS IV SOLN: INTRAVENOUS | Status: DC | PRN

## 2023-04-27 MED ORDER — PROPOFOL 10 MG/ML IV BOLUS
INTRAVENOUS | Status: DC | PRN
  Administered 2023-04-27: 40 mg via INTRAVENOUS
  Administered 2023-04-27: 100 mg via INTRAVENOUS

## 2023-04-27 NOTE — Anesthesia Postprocedure Evaluation (Signed)
Anesthesia Post Note  Patient: Autumn Graves  Procedure(s) Performed: COLONOSCOPY WITH PROPOFOL POLYPECTOMY  Patient location during evaluation: PACU Anesthesia Type: General Level of consciousness: awake and alert Pain management: pain level controlled Vital Signs Assessment: post-procedure vital signs reviewed and stable Respiratory status: spontaneous breathing, nonlabored ventilation, respiratory function stable and patient connected to nasal cannula oxygen Cardiovascular status: blood pressure returned to baseline and stable Postop Assessment: no apparent nausea or vomiting Anesthetic complications: no   There were no known notable events for this encounter.   Last Vitals:  Vitals:   04/27/23 0717 04/27/23 0759  BP: 137/86 109/67  Pulse: 94 81  Resp: 15 18  Temp: 36.6 C 36.4 C  SpO2: 94% 97%    Last Pain:  Vitals:   04/27/23 0759  TempSrc: Oral  PainSc: 0-No pain                 Leanndra Pember L Dade Rodin

## 2023-04-27 NOTE — Anesthesia Procedure Notes (Signed)
Date/Time: 04/27/2023 7:31 AM  Performed by: Julian Reil, CRNAPre-anesthesia Checklist: Patient identified, Emergency Drugs available, Suction available and Patient being monitored Patient Re-evaluated:Patient Re-evaluated prior to induction Oxygen Delivery Method: Nasal cannula Induction Type: IV induction Placement Confirmation: positive ETCO2

## 2023-04-27 NOTE — H&P (Signed)
Primary Care Physician:  Estanislado Pandy, MD Primary Gastroenterologist:  Dr. Tasia Catchings  Pre-Procedure History & Physical: HPI:  Autumn Graves is a 57 y.o. female is here for a colonoscopy for colon cancer screening purposes.  Patient garndmother with CRC.  No melena or hematochezia.  No abdominal pain or unintentional weight loss.  No change in bowel habits.  Overall feels well from a GI standpoint. Last colonoscopy 5 years ago  Past Medical History:  Diagnosis Date   Diabetes ([facility])    Hypertension    PONV (postoperative nausea and vomiting)     Past Surgical History:  Procedure Laterality Date   BIOPSY  01/20/2018   Procedure: BIOPSY;  Surgeon: Malissa Hippo, MD;  [facility];  Service: Endoscopy;;  colon   CESAREAN SECTION  1990   CHOLECYSTECTOMY     COLONOSCOPY N/A 01/20/2018   Procedure: COLONOSCOPY;  Surgeon: Malissa Hippo, MD;  [facility];  Service: Endoscopy;  Laterality: N/A;  730   GALLBLADDER SURGERY     POLYPECTOMY  01/20/2018   Procedure: POLYPECTOMY;  Surgeon: Malissa Hippo, MD;  [facility];  Service: Endoscopy;;  colon    Prior to Admission medications   Medication Sig Start Date End Date Taking? Authorizing Provider  aspirin 81 MG tablet Take 1 tablet (81 mg total) by mouth daily. 01/27/18  Yes Rehman, Joline Maxcy, MD  famotidine (PEPCID) 20 MG tablet Take 20 mg by mouth 2 (two) times daily.   Yes [provider]  losartan (COZAAR) 25 MG tablet Take 25 mg by mouth every evening.   Yes [provider]  metFORMIN (GLUCOPHAGE-XR) 500 MG 24 hr tablet Take 500 mg by mouth daily. 07/08/16  Yes [provider]  simvastatin (ZOCOR) 20 MG tablet Take 20 mg by mouth daily at 6 PM.  06/28/16  Yes [provider]  Semaglutide,0.25 or 0.5MG /DOS, (OZEMPIC, 0.25 OR 0.5 MG/DOSE,) 2 MG/3ML SOPN Inject 0.5 mg into the skin 7 days.    [provider]    Allergies as of 03/04/2023 - Review Complete 05/05/2018   Allergen Reaction Noted   Lisinopril Cough 07/28/2016   Topiramate Itching 07/28/2016    Family History  Problem Relation Age of Onset   Cancer Mother    Heart failure Father    Stroke Sister    Colon cancer Maternal Grandmother    Stroke Maternal Grandfather     Social History   Socioeconomic History   Marital status: Married    Spouse name: Not on file   Number of children: 2   Years of education: 12   Highest education level: Not on file  Occupational History   Occupation: Medical sales representative  Tobacco Use   Smoking status: Never   Smokeless tobacco: Never  Vaping Use   Vaping status: Never Used  Substance and Sexual Activity   Alcohol use: No   Drug use: No   Sexual activity: Not on file    Comment: Married  Other Topics Concern   Not on file  Social History Narrative   Lives at home w/ her husband and son   Writes right-handed, eats left-handed   Caffeine: occasional soda   Social Determinants of Health   Financial Resource Strain: Not on file  Food Insecurity: Not on file  Transportation Needs: Not on file  Physical Activity: Not on file  Stress: Not on file  Social Connections: Not on file  Intimate Partner Violence: Not At Risk (  01/20/2021)   [facility]   Humiliation, Afraid, Rape, and Kick questionnaire    Fear of Current or Ex-Partner: No    Emotionally Abused: No    Physically Abused: No    Sexually Abused: No    Review of Systems: See HPI, otherwise negative ROS  Physical Exam: Vital signs in last 24 hours: Temp:  [97.8 F (36.6 C)] 97.8 F (36.6 C) (11/12 0717) Pulse Rate:  [94] 94 (11/12 0717) Resp:  [15] 15 (11/12 0717) BP: (137)/(86) 137/86 (11/12 0717) SpO2:  [94 %] 94 % (11/12 0717)   General:   Alert,  Well-developed, well-nourished, pleasant and cooperative in NAD Head:  Normocephalic and atraumatic. Eyes:  Sclera clear, no icterus.   Conjunctiva pink. Ears:  Normal auditory acuity. Nose:  No  deformity, discharge,  or lesions. Msk:  Symmetrical without gross deformities. Normal posture. Extremities:  Without clubbing or edema. Neurologic:  Alert and  oriented x4;  grossly normal neurologically. Skin:  Intact without significant lesions or rashes. Psych:  Alert and cooperative. Normal mood and affect.  Impression/Plan: Autumn Graves is here for a colonoscopy to be performed for colon cancer screening purposes.  The risks of the procedure including infection, bleed, or perforation as well as benefits, limitations, alternatives and imponderables have been reviewed with the patient. Questions have been answered. All parties agreeable.

## 2023-04-27 NOTE — Discharge Instructions (Addendum)

## 2023-04-27 NOTE — Op Note (Signed)
[facility] Patient Name: Autumn Graves Procedure Date: 04/27/2023 7:10 AM MRN: [id number] Date of Birth: Sep 28, 1965 Attending MD: Sanjuan Dame , MD, [id number] CSN: [id number] Age: 57 Admit Type: Outpatient Procedure:                Colonoscopy Indications:              High risk colon cancer surveillance: Personal                            history of colonic polyps Providers:                Sanjuan Dame, MD, Buel Ream. Thomasena Edis RN, RN,                            Francoise Ceo RN, RN, Dyann Ruddle Referring MD:              Medicines:                Monitored Anesthesia Care Complications:            No immediate complications. Estimated Blood Loss:     Estimated blood loss was minimal. Procedure:                Pre-Anesthesia Assessment:                           - Prior to the procedure, a History and Physical                            was performed, and patient medications and                            allergies were reviewed. The patient's tolerance of                            previous anesthesia was also reviewed. The risks                            and benefits of the procedure and the sedation                            options and risks were discussed with the patient.                            All questions were answered, and informed consent                            was obtained. Prior Anticoagulants: The patient has                            taken no anticoagulant or antiplatelet agents. ASA                            Grade Assessment: II - A patient with mild systemic  disease. After reviewing the risks and benefits,                            the patient was deemed in satisfactory condition to                            undergo the procedure.                           After obtaining informed consent, the colonoscope                            was passed under direct vision. Throughout the                            procedure,  the patient's blood pressure, pulse, and                            oxygen saturations were monitored continuously. The                            [id number]) scope was introduced through the                            anus and advanced to the the cecum, identified by                            appendiceal orifice and ileocecal valve. The                            colonoscopy was performed without difficulty. The                            patient tolerated the procedure well. The quality                            of the bowel preparation was evaluated using the                            BBPS [facility] Bowel Preparation Scale) with scores                            of: Right Colon = 3, Transverse Colon = 3 and Left                            Colon = 3 (entire mucosa seen well with no residual                            staining, small fragments of stool or opaque                            liquid). The total BBPS score equals 9. The  ileocecal valve, appendiceal orifice, and rectum                            were photographed. Scope In: 7:34:37 AM Scope Out: 7:55:09 AM Scope Withdrawal Time: 0 hours 15 minutes 47 seconds  Total Procedure Duration: 0 hours 20 minutes 32 seconds  Findings:      Three sessile polyps were found in the sigmoid colon, ascending colon       and cecum. The polyps were 4 to 6 mm in size. These polyps were removed       with a cold snare. Resection and retrieval were complete.      Scattered diverticula were found in the left colon.      Non-bleeding external and internal hemorrhoids were found during       retroflexion. The hemorrhoids were small. Impression:               - Three 4 to 6 mm polyps in the sigmoid colon, in                            the ascending colon and in the cecum, removed with                            a cold snare. Resected and retrieved.                           - Diverticulosis in the left colon.                            - Non-bleeding external and internal hemorrhoids. Moderate Sedation:      Per Anesthesia Care Recommendation:           - Patient has a contact number available for                            emergencies. The signs and symptoms of potential                            delayed complications were discussed with the                            patient. Return to normal activities tomorrow.                            Written discharge instructions were provided to the                            patient.                           - Resume previous diet.                           - Continue present medications.                           - Await pathology results.                           -  Repeat colonoscopy in 5 years for surveillance.                           - Return to primary care physician as previously                            scheduled. Procedure Code(s):        --- Professional ---                           [id number], Colonoscopy, flexible; with removal of                            tumor(s), polyp(s), or other lesion(s) by snare                            technique Diagnosis Code(s):        --- Professional ---                           Z86.010, Personal history of colonic polyps                           D12.5, Benign neoplasm of sigmoid colon                           D12.2, Benign neoplasm of ascending colon                           D12.0, Benign neoplasm of cecum                           K64.8, Other hemorrhoids                           K57.30, Diverticulosis of large intestine without                            perforation or abscess without bleeding CPT copyright 2022 [employer]. All rights reserved. The codes documented in this report are preliminary and upon coder review may  be revised to meet current compliance requirements. Sanjuan Dame, MD Sanjuan Dame, MD 04/27/2023 8:00:57 AM This report has been signed  electronically. Number of Addenda: 0

## 2023-04-27 NOTE — Transfer of Care (Signed)
Immediate Anesthesia Transfer of Care Note  Patient: Autumn Graves  Procedure(s) Performed: COLONOSCOPY WITH PROPOFOL POLYPECTOMY  Patient Location: Endoscopy Unit  Anesthesia Type:General  Level of Consciousness: awake  Airway & Oxygen Therapy: Patient Spontanous Breathing  Post-op Assessment: Report given to RN and Post -op Vital signs reviewed and stable  Post vital signs: Reviewed and stable  Last Vitals:  Vitals Value Taken Time  BP    Temp    Pulse    Resp    SpO2      Last Pain:  Vitals:   04/27/23 0732  TempSrc:   PainSc: 3       Patients Stated Pain Goal: 5 (04/27/23 0717)  Complications: No notable events documented.

## 2023-04-28 ENCOUNTER — Encounter (INDEPENDENT_AMBULATORY_CARE_PROVIDER_SITE_OTHER): Payer: Self-pay | Admitting: *Deleted

## 2023-04-28 LAB — SURGICAL PATHOLOGY

## 2023-04-28 NOTE — Progress Notes (Signed)
I reviewed the pathology results. Ann, can you send her a letter with the findings as described below please? Repeat colonoscopy in 5 years  Thanks,  Autumn Lawman, MD Gastroenterology and Hepatology [facility]  ---------------------------------------------------------------------------------------------  [facility] [address], [place], [place] [address] Phone:  [phone]   04/28/23 [place], [place]   Dear Autumn Graves,  I am writing to inform you that the biopsies taken during your recent endoscopic examination showed:  I am writing to let you know the results of your recent colonoscopy.  You had a total of 3 polyps removed. The pathology came back as "tubular adenoma and sessile serrated polyp." These findings are NOT cancer, but had the polyps remained in your colon, they could have turned into cancer.  Given these findings, it is recommended that your next colonoscopy be performed in 5 years.  Please call us at [phone] if you have persistent problems or have questions about your condition that have not been fully answered at this time.  Sincerely,  Autumn Lawman, MD Gastroenterology and Hepatology

## 2023-04-29 ENCOUNTER — Encounter (INDEPENDENT_AMBULATORY_CARE_PROVIDER_SITE_OTHER): Payer: Self-pay | Admitting: *Deleted

## 2023-05-04 ENCOUNTER — Encounter (HOSPITAL_COMMUNITY): Payer: Self-pay | Admitting: Gastroenterology
